# Patient Record
Sex: Male | Born: 1950 | Race: White | Hispanic: No | Marital: Married | Smoking: Never smoker
Health system: Southern US, Community
[De-identification: ages and names within clinical notes are randomized; demographics above are authoritative.]

## PROBLEM LIST (undated history)

## (undated) DIAGNOSIS — N1831 Chronic kidney disease, stage 3a: Secondary | ICD-10-CM

## (undated) DIAGNOSIS — J45909 Unspecified asthma, uncomplicated: Secondary | ICD-10-CM

## (undated) DIAGNOSIS — E119 Type 2 diabetes mellitus without complications: Secondary | ICD-10-CM

## (undated) DIAGNOSIS — N201 Calculus of ureter: Secondary | ICD-10-CM

## (undated) DIAGNOSIS — E785 Hyperlipidemia, unspecified: Secondary | ICD-10-CM

## (undated) DIAGNOSIS — I10 Essential (primary) hypertension: Secondary | ICD-10-CM

## (undated) DIAGNOSIS — Z8709 Personal history of other diseases of the respiratory system: Secondary | ICD-10-CM

## (undated) DIAGNOSIS — Z972 Presence of dental prosthetic device (complete) (partial): Secondary | ICD-10-CM

---

## 2004-08-29 ENCOUNTER — Ambulatory Visit: Payer: Self-pay | Admitting: Gastroenterology

## 2006-10-16 ENCOUNTER — Ambulatory Visit: Payer: Self-pay | Admitting: Internal Medicine

## 2007-02-20 ENCOUNTER — Other Ambulatory Visit: Payer: Self-pay

## 2007-02-20 ENCOUNTER — Emergency Department: Payer: Self-pay | Admitting: Emergency Medicine

## 2007-10-29 ENCOUNTER — Ambulatory Visit: Payer: Self-pay | Admitting: General Practice

## 2013-05-25 ENCOUNTER — Other Ambulatory Visit: Payer: Self-pay | Admitting: Urology

## 2013-05-28 ENCOUNTER — Encounter (HOSPITAL_BASED_OUTPATIENT_CLINIC_OR_DEPARTMENT_OTHER): Payer: Self-pay | Admitting: *Deleted

## 2013-05-28 NOTE — Progress Notes (Signed)
NPO AFTER MN. PT VERBALIZED UNDERSTANDING NO DIPPING TOBACCO AFTER MN. ARRIVES AT 0715. NEEDS ISTAT AND EKG. WILL TAKE METOPROLOL AND LIPITOR AM OF SURG W/ SIP OF WATER.

## 2013-06-04 ENCOUNTER — Encounter (HOSPITAL_BASED_OUTPATIENT_CLINIC_OR_DEPARTMENT_OTHER): Payer: Self-pay | Admitting: Anesthesiology

## 2013-06-04 ENCOUNTER — Encounter (HOSPITAL_BASED_OUTPATIENT_CLINIC_OR_DEPARTMENT_OTHER)
Admission: RE | Disposition: A | Discharge status: Home / Self Care | Payer: Self-pay | Source: Ambulatory Visit | Attending: Urology

## 2013-06-04 ENCOUNTER — Encounter (HOSPITAL_BASED_OUTPATIENT_CLINIC_OR_DEPARTMENT_OTHER): Payer: Self-pay | Admitting: *Deleted

## 2013-06-04 ENCOUNTER — Ambulatory Visit (HOSPITAL_BASED_OUTPATIENT_CLINIC_OR_DEPARTMENT_OTHER): Payer: PRIVATE HEALTH INSURANCE | Admitting: Anesthesiology

## 2013-06-04 ENCOUNTER — Ambulatory Visit (HOSPITAL_COMMUNITY): Payer: PRIVATE HEALTH INSURANCE

## 2013-06-04 ENCOUNTER — Ambulatory Visit (HOSPITAL_BASED_OUTPATIENT_CLINIC_OR_DEPARTMENT_OTHER)
Admission: RE | Admit: 2013-06-04 | Discharge: 2013-06-04 | Disposition: A | Discharge status: Home / Self Care | Payer: PRIVATE HEALTH INSURANCE | Source: Ambulatory Visit | Attending: Urology | Admitting: Urology

## 2013-06-04 DIAGNOSIS — I10 Essential (primary) hypertension: Secondary | ICD-10-CM | POA: Insufficient documentation

## 2013-06-04 DIAGNOSIS — E7800 Pure hypercholesterolemia, unspecified: Secondary | ICD-10-CM | POA: Insufficient documentation

## 2013-06-04 DIAGNOSIS — N201 Calculus of ureter: Secondary | ICD-10-CM | POA: Insufficient documentation

## 2013-06-04 DIAGNOSIS — Z79899 Other long term (current) drug therapy: Secondary | ICD-10-CM | POA: Insufficient documentation

## 2013-06-04 DIAGNOSIS — E119 Type 2 diabetes mellitus without complications: Secondary | ICD-10-CM | POA: Insufficient documentation

## 2013-06-04 DIAGNOSIS — Z7982 Long term (current) use of aspirin: Secondary | ICD-10-CM | POA: Insufficient documentation

## 2013-06-04 HISTORY — DX: Hyperlipidemia, unspecified: E78.5

## 2013-06-04 HISTORY — PX: CYSTOSCOPY WITH RETROGRADE PYELOGRAM, URETEROSCOPY AND STENT PLACEMENT: SHX5789

## 2013-06-04 HISTORY — DX: Type 2 diabetes mellitus without complications: E11.9

## 2013-06-04 HISTORY — DX: Personal history of other diseases of the respiratory system: Z87.09

## 2013-06-04 HISTORY — DX: Calculus of ureter: N20.1

## 2013-06-04 HISTORY — DX: Essential (primary) hypertension: I10

## 2013-06-04 HISTORY — PX: HOLMIUM LASER APPLICATION: SHX5852

## 2013-06-04 LAB — POCT I-STAT 4, (NA,K, GLUC, HGB,HCT)
Glucose, Bld: 170 mg/dL — ABNORMAL HIGH (ref 70–99)
HCT: 40 % (ref 39.0–52.0)
Potassium: 4.2 mEq/L (ref 3.5–5.1)
Sodium: 143 mEq/L (ref 135–145)

## 2013-06-04 SURGERY — CYSTOURETEROSCOPY, WITH RETROGRADE PYELOGRAM AND STENT INSERTION
Anesthesia: General | Site: Ureter | Laterality: Left | Wound class: Clean Contaminated

## 2013-06-04 MED ORDER — DEXAMETHASONE SODIUM PHOSPHATE 4 MG/ML IJ SOLN
INTRAMUSCULAR | Status: DC | PRN
  Administered 2013-06-04: 8 mg via INTRAVENOUS

## 2013-06-04 MED ORDER — CEFAZOLIN SODIUM 1-5 GM-% IV SOLN
1.0000 g | INTRAVENOUS | Status: DC
  Filled 2013-06-04: qty 50

## 2013-06-04 MED ORDER — LACTATED RINGERS IV SOLN
INTRAVENOUS | Status: DC
  Administered 2013-06-04 (×2): via INTRAVENOUS
  Filled 2013-06-04: qty 1000

## 2013-06-04 MED ORDER — PROMETHAZINE HCL 25 MG/ML IJ SOLN
6.2500 mg | INTRAMUSCULAR | Status: DC | PRN
  Filled 2013-06-04: qty 1

## 2013-06-04 MED ORDER — IOHEXOL 350 MG/ML SOLN
INTRAVENOUS | Status: DC | PRN
  Administered 2013-06-04: 17 mL

## 2013-06-04 MED ORDER — HYDROCODONE-ACETAMINOPHEN 5-325 MG PO TABS: 1.0000 | ORAL_TABLET | Freq: Four times a day (QID) | ORAL | Status: DC | PRN

## 2013-06-04 MED ORDER — GLYCOPYRROLATE 0.2 MG/ML IJ SOLN
INTRAMUSCULAR | Status: DC | PRN
  Administered 2013-06-04: 0.2 mg via INTRAVENOUS

## 2013-06-04 MED ORDER — LIDOCAINE HCL (CARDIAC) 20 MG/ML IV SOLN
INTRAVENOUS | Status: DC | PRN
  Administered 2013-06-04: 50 mg via INTRAVENOUS

## 2013-06-04 MED ORDER — CEFAZOLIN SODIUM-DEXTROSE 2-3 GM-% IV SOLR
2.0000 g | INTRAVENOUS | Status: AC
  Administered 2013-06-04: 2 g via INTRAVENOUS
  Filled 2013-06-04: qty 50

## 2013-06-04 MED ORDER — FENTANYL CITRATE 0.05 MG/ML IJ SOLN
INTRAMUSCULAR | Status: DC | PRN
  Administered 2013-06-04: 50 ug via INTRAVENOUS
  Administered 2013-06-04 (×2): 25 ug via INTRAVENOUS

## 2013-06-04 MED ORDER — PROPOFOL 10 MG/ML IV BOLUS
INTRAVENOUS | Status: DC | PRN
  Administered 2013-06-04: 200 mg via INTRAVENOUS

## 2013-06-04 MED ORDER — MIDAZOLAM HCL 5 MG/5ML IJ SOLN
INTRAMUSCULAR | Status: DC | PRN
  Administered 2013-06-04: 2 mg via INTRAVENOUS

## 2013-06-04 MED ORDER — FENTANYL CITRATE 0.05 MG/ML IJ SOLN
25.0000 ug | INTRAMUSCULAR | Status: DC | PRN
  Filled 2013-06-04: qty 1

## 2013-06-04 MED ORDER — KETOROLAC TROMETHAMINE 30 MG/ML IJ SOLN
15.0000 mg | Freq: Once | INTRAMUSCULAR | Status: DC | PRN
  Filled 2013-06-04: qty 1

## 2013-06-04 MED ORDER — ONDANSETRON HCL 4 MG/2ML IJ SOLN
INTRAMUSCULAR | Status: DC | PRN
  Administered 2013-06-04: 4 mg via INTRAVENOUS

## 2013-06-04 MED ORDER — STERILE WATER FOR IRRIGATION IR SOLN
Status: DC | PRN
  Administered 2013-06-04: 6000 mL

## 2013-06-04 SURGICAL SUPPLY — 30 items
ADAPTER CATH URET PLST 4-6FR (CATHETERS) IMPLANT
BAG DRAIN URO-CYSTO SKYTR STRL (DRAIN) ×2 IMPLANT
BASKET LASER NITINOL 1.9FR (BASKET) IMPLANT
BASKET STNLS GEMINI 4WIRE 3FR (BASKET) IMPLANT
BASKET ZERO TIP NITINOL 2.4FR (BASKET) ×2 IMPLANT
BRUSH URET BIOPSY 3F (UROLOGICAL SUPPLIES) IMPLANT
CANISTER SUCT LVC 12 LTR MEDI- (MISCELLANEOUS) ×2 IMPLANT
CATH INTERMIT  6FR 70CM (CATHETERS) IMPLANT
CATH URET 5FR 28IN CONE TIP (BALLOONS)
CATH URET 5FR 28IN OPEN ENDED (CATHETERS) IMPLANT
CATH URET 5FR 70CM CONE TIP (BALLOONS) IMPLANT
CLOTH BEACON ORANGE TIMEOUT ST (SAFETY) ×2 IMPLANT
DRAPE CAMERA CLOSED 9X96 (DRAPES) IMPLANT
GLOVE BIO SURGEON STRL SZ 6 (GLOVE) ×2 IMPLANT
GLOVE BIO SURGEON STRL SZ 6.5 (GLOVE) ×2 IMPLANT
GLOVE BIO SURGEON STRL SZ7 (GLOVE) ×2 IMPLANT
GUIDEWIRE 0.038 PTFE COATED (WIRE) ×2 IMPLANT
GUIDEWIRE ANG ZIPWIRE 038X150 (WIRE) IMPLANT
GUIDEWIRE STR DUAL SENSOR (WIRE) IMPLANT
KIT BALLIN UROMAX 15FX10 (LABEL) IMPLANT
KIT BALLN UROMAX 15FX4 (MISCELLANEOUS) IMPLANT
KIT BALLN UROMAX 26 75X4 (MISCELLANEOUS)
LASER FIBER DISP (UROLOGICAL SUPPLIES) ×2 IMPLANT
LASER FIBER DISP 1000U (UROLOGICAL SUPPLIES) IMPLANT
NS IRRIG 500ML POUR BTL (IV SOLUTION) IMPLANT
PACK CYSTOSCOPY (CUSTOM PROCEDURE TRAY) ×2 IMPLANT
SET HIGH PRES BAL DIL (LABEL)
SHEATH URET ACCESS 12FR/35CM (UROLOGICAL SUPPLIES) IMPLANT
SHEATH URET ACCESS 12FR/55CM (UROLOGICAL SUPPLIES) IMPLANT
STENT URET 6FRX24 CONTOUR (STENTS) ×2 IMPLANT

## 2013-06-04 NOTE — H&P (Signed)
History of Present Illness     Craig Donaldson returns for follow-up management left ureteral calculus.  He had moderate left lower quadrant pain about 2 weeks ago.  He has not had any pain since.  He has not passed a stone either.  He voids well. KUB shows the stone in the same location in the distal left ureter.   Past Medical History Problems  1. History of  Asthma 493.90 2. History of  Diabetes Mellitus 250.00 3. History of  Hepatitis 573.3 4. History of  Hypercholesterolemia 272.0 5. History of  Hypertension 401.9  Current Meds 1. Aspirin 81 MG Oral Tablet; Therapy: (Recorded:23Apr2014) to 2. Atorvastatin Calcium 20 MG Oral Tablet; Therapy: (Recorded:23Apr2014) to 3. Benazepril-Hydrochlorothiazide 20-12.5 MG Oral Tablet; Therapy: (Recorded:23Apr2014) to 4. Byetta 10 MCG Pen 10 MCG/0.04ML SOLN; Therapy: (Recorded:23Apr2014) to 5. Glimepiride 4 MG Oral Tablet; Therapy: (Recorded:23Apr2014) to 6. MetFORMIN HCl 1000 MG Oral Tablet; Therapy: (Recorded:23Apr2014) to 7. Metoprolol Tartrate 50 MG Oral Tablet; Therapy: (Recorded:23Apr2014) to  Allergies Medication  1. No Known Drug Allergies  Family History Problems  1. Family history of  Family Health Status Number Of Children 1 son/ 1 daughter 2. Family history of  Father Deceased At Age 90 lung ca 3. Family history of  Lung Cancer V16.1 4. Paternal history of  Mother Deceased At Age 59 complications with bowel problems 5. Paternal history of  Short Bowel Syndrome  Social History Problems  1. Caffeine Use 3 2. Chewing Nicotine-containing Substances Gum 3. Marital History - Currently Married 4. Never A Smoker Denied  5. History of  Alcohol Use  Review of Systems Genitourinary, constitutional, skin, eye, otolaryngeal, hematologic/lymphatic, cardiovascular, pulmonary, endocrine, musculoskeletal, gastrointestinal, neurological and psychiatric system(s) were reviewed and pertinent findings if present are noted.    Physical  Exam Constitutional: Well nourished and well developed . No acute distress.  ENT:. The ears and nose are normal in appearance.  Neck: The appearance of the neck is normal and no neck mass is present.  Pulmonary: No respiratory distress and normal respiratory rhythm and effort.  Cardiovascular: Heart rate and rhythm are normal . No peripheral edema.  Abdomen: The abdomen is soft and nontender. No masses are palpated. No CVA tenderness. No hernias are palpable. No hepatosplenomegaly noted.  Genitourinary: Examination of the penis demonstrates no discharge, no masses, no lesions and a normal meatus. The scrotum is without lesions. The right epididymis is palpably normal and non-tender. The left epididymis is palpably normal and non-tender. The right testis is non-tender and without masses. The left testis is non-tender and without masses.  Lymphatics: The femoral and inguinal nodes are not enlarged or tender.  Skin: Normal skin turgor, no visible rash and no visible skin lesions.  Neuro/Psych:. Mood and affect are appropriate.    Results/Data Urine [Data Includes: Last 1 Day]   11Aug2014  COLOR YELLOW   APPEARANCE CLEAR   SPECIFIC GRAVITY 1.025   pH 6.0   GLUCOSE NEG mg/dL  BILIRUBIN NEG   KETONE NEG mg/dL  BLOOD TRACE   PROTEIN NEG mg/dL  UROBILINOGEN 0.2 mg/dL  NITRITE NEG   LEUKOCYTE ESTERASE NEG   SQUAMOUS EPITHELIAL/HPF NONE SEEN   WBC NONE SEEN WBC/hpf  RBC 0-2 RBC/hpf  BACTERIA NONE SEEN   CRYSTALS NONE SEEN   CASTS NONE SEEN     KUB INDICATION: Left distal urteral calculus KUB shows normal bowel gas pattern.  Psoas shadows are normal.  There is a 5 mm calcification in the anatomic location of the  left ureter.  It has not significantly changed in position since the last KUB of June 17.   IMPRESSION: Left distal urteral calculus unchanged in position since KUB of June 17.   Assessment Assessed  1. Distal Ureteral Stone On The Left 592.1  Plan Distal Ureteral Stone On  The Left (592.1)  1. KUB  Done: 11Aug2014 12:00AM Health Maintenance (V70.0)  2. UA With REFLEX  Done: 11Aug2014 02:30PM   Since the patient has not passed the stone he needs stone manipulation.  The procedure, risks, benefits were explained to the patient.  The risks include but are not limited to hemorrhage, infection, ureteral injury, inability to find or extract the stone.  He understands and wishes to proceed.

## 2013-06-04 NOTE — Op Note (Signed)
Craig Donaldson is a 62 y.o.   06/04/2013  General  Pre-op diagnosis: Left distal ureteral calculus  Postop diagnosis: Left mid ureteral calculus  Procedure done: Cystoscopy, left retrograde pyelogram, ureteroscopy with holmium laser of left distal ureteral calculus, stone extraction, insertion of double-J stent  Surgeon: Craig Donaldson. Craig Donaldson  Anesthesia: General  Indication: Patient is a 62 years old male who was seen in the office about 4 months ago for gross hematuria. CT scan revealed a 5 mm proximal left ureteral calculus, no renal mass. He has been having pain on and off on the left side. KUB showed that the stone had migrated in the distal ureter. He is now scheduled for cystoscopy and stone manipulation.  Procedure: Patient was identified by his wrist band and proper timeout was taken.  Under general anesthesia he was prepped and draped and placed in the dorsolithotomy position. A panendoscope was inserted in the bladder. The urethra is normal. He has moderate prostatic hypertrophy. The bladder mucosa is normal. There is no stone or tumor in the bladder. The ureteral orifices are in normal position and shape.  Left retrograde pyelogram:  A cone-tip catheter was passed through the cystoscope and the left ureteral orifice. Contrast was then injected through the cone-tip catheter. The distal ureter appears normal. There is a filling defect in the mid ureter consistent with the known stone. The cone-tip catheter was then removed.  A sensor wire was passed through the cystoscope and the left ureter. The cystoscope was removed.  A semirigid ureteroscope was inserted in the bladder but could not be passed through the ureteral orifice. The ureteroscope was removed. The intramural ureter was then dilated with the ureteroscope access sheath. The ureteroscope was then reinserted in the bladder and passed through the ureteral orifice without difficulty. The stone was identified in the mid ureter. With a  365 microfiber holmium laser at a setting of 0.5 J and 20 shocks the stone was fragmented in multiple smaller fragments. The smaller fragments were then removed with the Nitinol basket. There was no evidence of remaining stone fragments in the ureter.  Contrast was then injected through the ureteroscope. There is no evidence of filling defect in the ureter, no extravasation of contrast. The ureteroscope was then removed.  The sensor wire was then backloaded into the cystoscope and a #6 [place] last 24 double-J stent was passed over the sensor wire and when the tip of the double-J stent was noted in the renal pelvis the sensor wire was removed. The proximal curl of the double-J stent is in the collecting system. The distal curl is in the bladder. A string was left attached to the double-J stent. The bladder was then emptied and the cystoscope removed.  The patient tolerated the procedure well and left the OR in satisfactory condition to postanesthesia care unit.

## 2013-06-04 NOTE — Anesthesia Postprocedure Evaluation (Signed)
  Anesthesia Post-op Note  Patient: Craig Donaldson  Procedure(s) Performed: Procedure(s) (LRB): CYSTOSCOPY WITH RETROGRADE PYELOGRAM, URETEROSCOPY AND STENT PLACEMENT (Left) HOLMIUM LASER APPLICATION (Left)  Patient Location: PACU  Anesthesia Type: General  Level of Consciousness: awake and alert   Airway and Oxygen Therapy: Patient Spontanous Breathing  Post-op Pain: mild  Post-op Assessment: Post-op Vital signs reviewed, Patient's Cardiovascular Status Stable, Respiratory Function Stable, Patent Airway and No signs of Nausea or vomiting  Last Vitals:  Filed Vitals:   06/04/13 0945  BP: 128/78  Pulse: 90  Temp:   Resp: 15    Post-op Vital Signs: stable   Complications: No apparent anesthesia complications

## 2013-06-04 NOTE — Anesthesia Procedure Notes (Signed)
Procedure Name: LMA Insertion Date/Time: 06/04/2013 8:45 AM Performed by: Renella Cunas D Pre-anesthesia Checklist: Patient identified, Emergency Drugs available, Suction available and Patient being monitored Patient Re-evaluated:Patient Re-evaluated prior to inductionOxygen Delivery Method: Circle System Utilized Preoxygenation: Pre-oxygenation with 100% oxygen Intubation Type: IV induction Ventilation: Mask ventilation without difficulty LMA: LMA inserted LMA Size: 4.0 Number of attempts: 1 Airway Equipment and Method: bite block Placement Confirmation: positive ETCO2 Tube secured with: Tape Dental Injury: Teeth and Oropharynx as per pre-operative assessment

## 2013-06-04 NOTE — Anesthesia Preprocedure Evaluation (Signed)
Anesthesia Evaluation  Patient identified by MRN, date of birth, ID band Patient awake    Reviewed: Allergy & Precautions, H&P , NPO status , Patient's Chart, lab work & pertinent test results  Airway Mallampati: II TM Distance: <3 FB Neck ROM: Full    Dental no notable dental hx.    Pulmonary neg pulmonary ROS,  breath sounds clear to auscultation  Pulmonary exam normal       Cardiovascular hypertension, Pt. on home beta blockers and Pt. on medications Rhythm:Regular Rate:Normal     Neuro/Psych negative neurological ROS  negative psych ROS   GI/Hepatic negative GI ROS, Neg liver ROS,   Endo/Other  diabetes, Oral Hypoglycemic Agents  Renal/GU negative Renal ROS  negative genitourinary   Musculoskeletal negative musculoskeletal ROS (+)   Abdominal   Peds negative pediatric ROS (+)  Hematology negative hematology ROS (+)   Anesthesia Other Findings   Reproductive/Obstetrics negative OB ROS                           Anesthesia Physical Anesthesia Plan  ASA: II  Anesthesia Plan: General   Post-op Pain Management:    Induction: Intravenous  Airway Management Planned: LMA  Additional Equipment:   Intra-op Plan:   Post-operative Plan:   Informed Consent: I have reviewed the patients History and Physical, chart, labs and discussed the procedure including the risks, benefits and alternatives for the proposed anesthesia with the patient or authorized representative who has indicated his/her understanding and acceptance.   Dental advisory given  Plan Discussed with: CRNA and Surgeon  Anesthesia Plan Comments:         Anesthesia Quick Evaluation

## 2013-06-04 NOTE — Progress Notes (Signed)
Does not need ekg per Dr.Rose

## 2013-06-04 NOTE — Transfer of Care (Signed)
Immediate Anesthesia Transfer of Care Note  Patient: Craig Donaldson  Procedure(s) Performed: Procedure(s) (LRB): CYSTOSCOPY WITH RETROGRADE PYELOGRAM, URETEROSCOPY AND STENT PLACEMENT (Left) HOLMIUM LASER APPLICATION (Left)  Patient Location: PACU  Anesthesia Type: General  Level of Consciousness: awake, oriented, sedated and patient cooperative  Airway & Oxygen Therapy: Patient Spontanous Breathing and Patient connected to face mask oxygen  Post-op Assessment: Report given to PACU RN and Post -op Vital signs reviewed and stable  Post vital signs: Reviewed and stable  Complications: No apparent anesthesia complications

## 2013-06-07 ENCOUNTER — Encounter (HOSPITAL_BASED_OUTPATIENT_CLINIC_OR_DEPARTMENT_OTHER): Payer: Self-pay | Admitting: Urology

## 2016-04-07 ENCOUNTER — Emergency Department
Admission: EM | Admit: 2016-04-07 | Discharge: 2016-04-07 | Disposition: A | Discharge status: Home / Self Care | Payer: Medicare Other | Attending: Emergency Medicine | Admitting: Emergency Medicine

## 2016-04-07 ENCOUNTER — Other Ambulatory Visit: Payer: Self-pay

## 2016-04-07 ENCOUNTER — Encounter: Payer: Self-pay | Admitting: Emergency Medicine

## 2016-04-07 DIAGNOSIS — T7840XA Allergy, unspecified, initial encounter: Secondary | ICD-10-CM | POA: Diagnosis not present

## 2016-04-07 DIAGNOSIS — Z79899 Other long term (current) drug therapy: Secondary | ICD-10-CM | POA: Diagnosis not present

## 2016-04-07 DIAGNOSIS — I10 Essential (primary) hypertension: Secondary | ICD-10-CM | POA: Insufficient documentation

## 2016-04-07 DIAGNOSIS — Z960 Presence of urogenital implants: Secondary | ICD-10-CM | POA: Insufficient documentation

## 2016-04-07 DIAGNOSIS — E119 Type 2 diabetes mellitus without complications: Secondary | ICD-10-CM | POA: Diagnosis not present

## 2016-04-07 DIAGNOSIS — J45909 Unspecified asthma, uncomplicated: Secondary | ICD-10-CM | POA: Diagnosis not present

## 2016-04-07 DIAGNOSIS — Z7984 Long term (current) use of oral hypoglycemic drugs: Secondary | ICD-10-CM | POA: Insufficient documentation

## 2016-04-07 DIAGNOSIS — F17290 Nicotine dependence, other tobacco product, uncomplicated: Secondary | ICD-10-CM | POA: Insufficient documentation

## 2016-04-07 DIAGNOSIS — Z7982 Long term (current) use of aspirin: Secondary | ICD-10-CM | POA: Insufficient documentation

## 2016-04-07 DIAGNOSIS — E785 Hyperlipidemia, unspecified: Secondary | ICD-10-CM | POA: Diagnosis not present

## 2016-04-07 DIAGNOSIS — R21 Rash and other nonspecific skin eruption: Secondary | ICD-10-CM | POA: Diagnosis present

## 2016-04-07 MED ORDER — RANITIDINE HCL 150 MG PO TABS: 150.0000 mg | ORAL_TABLET | Freq: Two times a day (BID) | ORAL | Status: DC

## 2016-04-07 MED ORDER — HYDROXYZINE PAMOATE 25 MG PO CAPS: 25.0000 mg | ORAL_CAPSULE | Freq: Three times a day (TID) | ORAL | Status: DC | PRN

## 2016-04-07 MED ORDER — FAMOTIDINE 20 MG PO TABS
20.0000 mg | ORAL_TABLET | Freq: Once | ORAL | Status: AC
  Administered 2016-04-07: 20 mg via ORAL
  Filled 2016-04-07: qty 1

## 2016-04-07 MED ORDER — DEXAMETHASONE SODIUM PHOSPHATE 4 MG/ML IJ SOLN
4.0000 mg | Freq: Once | INTRAMUSCULAR | Status: AC
  Administered 2016-04-07: 4 mg via INTRAMUSCULAR
  Filled 2016-04-07: qty 1

## 2016-04-07 MED ORDER — PREDNISONE 10 MG PO TABS: ORAL_TABLET | ORAL | Status: DC

## 2016-04-07 NOTE — ED Notes (Signed)
States he had some dental work done last week. was placed on amoxil then developed a fine rash .rash noted to both arms legs and abd .  No resp distress noted  Positive itching

## 2016-04-07 NOTE — ED Provider Notes (Signed)
[facility] Emergency Department Provider Note  ____________________________________________  Time seen: Approximately 10:28 AM  I have reviewed the triage vital signs and the nursing notes.   HISTORY  Chief Complaint Rash and Allergic Reaction    HPI Craig Donaldson is a 65 y.o. male who presents for evaluation to the emergency room with a fine red raised rash to his arms abdomen and back some aches. Patient reports a recent (for amoxicillin after some dental work. Reports similar history of same to amoxicillin. Denies any difficulty breathing. Denies any chest pains tightness or shortness of breath.   Past Medical History  Diagnosis Date  . Hypertension   . Type 2 diabetes mellitus (HCC)   . Left ureteral calculus   . History of asthma     NO ISSUES SINCE THE 1990'S  . Hyperlipidemia     There are no active problems to display for this patient.   Past Surgical History  Procedure Laterality Date  . Cystoscopy with retrograde pyelogram, ureteroscopy and stent placement Left 06/04/2013    Procedure: CYSTOSCOPY WITH RETROGRADE PYELOGRAM, URETEROSCOPY AND STENT PLACEMENT;  Surgeon: Lindaann Slough, MD;  Location: [facility] [place];  Service: Urology;  Laterality: Left;  1 HR   . Holmium laser application Left 06/04/2013    Procedure: HOLMIUM LASER APPLICATION;  Surgeon: Lindaann Slough, MD;  Location: [facility] [place];  Service: Urology;  Laterality: Left;    Current Outpatient Rx  Name  Route  Sig  Dispense  Refill  . linagliptin (TRADJENTA) 5 MG TABS tablet   Oral   Take 5 mg by mouth daily.         Marland Kitchen aspirin 81 MG tablet   Oral   Take 81 mg by mouth daily.         Marland Kitchen atorvastatin (LIPITOR) 20 MG tablet   Oral   Take 20 mg by mouth every morning.         . benazepril-hydrochlorthiazide (LOTENSIN HCT) 10-12.5 MG per tablet   Oral   Take 1 tablet by mouth every morning.         Marland Kitchen exenatide (BYETTA 10 MCG PEN) 10  MCG/0.04ML SOPN injection   Subcutaneous   Inject into the skin 2 (two) times daily with a meal.         . glimepiride (AMARYL) 4 MG tablet   Oral   Take 4 mg by mouth 2 (two) times daily.         Marland Kitchen HYDROcodone-acetaminophen (NORCO) 5-325 MG per tablet   Oral   Take 1 tablet by mouth every 6 (six) hours as needed for pain.   30 tablet   0   . hydrOXYzine (VISTARIL) 25 MG capsule   Oral   Take 1 capsule (25 mg total) by mouth 3 (three) times daily as needed.   30 capsule   0   . metFORMIN (GLUCOPHAGE) 1000 MG tablet   Oral   Take 1,000 mg by mouth 2 (two) times daily with a meal.         . metoprolol succinate (TOPROL-XL) 50 MG 24 hr tablet   Oral   Take 50 mg by mouth every morning. Take with or immediately following a meal.         . predniSONE (DELTASONE) 10 MG tablet      Take 6 tablets on day 1 and then decrease by 1 tablet daily.   21 tablet   0   . ranitidine (ZANTAC) 150  MG tablet   Oral   Take 1 tablet (150 mg total) by mouth 2 (two) times daily.   14 tablet   1     Allergies Review of patient's allergies indicates no known allergies.  No family history on file.  Social History Social History  Substance Use Topics  . Smoking status: Never Smoker   . Smokeless tobacco: Current User    Types: Snuff, Chew     Comment: 34 YR TOBACCO USE  . Alcohol Use: No    Review of Systems Constitutional: No fever/chills ENT: No sore throat. Cardiovascular: Denies chest pain. Respiratory: Denies shortness of breath. Gastrointestinal: No abdominal pain.  No nausea, no vomiting.  No diarrhea.  No constipation. Musculoskeletal: Negative for back pain. Skin: Positive for rash. Positive for itching Neurological: Negative for headaches, focal weakness or numbness.  10-point ROS otherwise negative.  ____________________________________________   PHYSICAL EXAM:  VITAL SIGNS: ED Triage Vitals  Enc Vitals Group     BP 04/07/16 1014 119/85 mmHg      Pulse Rate 04/07/16 1014 83     Resp 04/07/16 1014 18     Temp 04/07/16 1014 97.7 F (36.5 C)     Temp Source 04/07/16 1014 Oral     SpO2 04/07/16 1014 96 %     Weight 04/07/16 1014 199 lb (90.266 kg)     Height 04/07/16 1014 5' 6.5" (1.689 m)     Head Cir --      Peak Flow --      Pain Score --      Pain Loc --      Pain Edu? --      Excl. in GC? --     Constitutional: Alert and oriented. Well appearing and in no acute distress. Head: Atraumatic. Nose: No congestion/rhinnorhea. Mouth/Throat: Mucous membranes are moist.  Oropharynx non-erythematous. Neck: No stridor.Supple full range of motion   Cardiovascular: Regular rate and rhythm with occasional sinus positive. Grossly normal heart sounds.  Good peripheral circulation. Respiratory: Normal respiratory effort.  No retractions. Lungs CTAB. No wheezing noted. Gastrointestinal: Soft and nontender. No distention. No abdominal bruits. No CVA tenderness. Musculoskeletal: No lower extremity tenderness nor edema.  No joint effusions. Neurologic:  Normal speech and language. No gross focal neurologic deficits are appreciated. No gait instability. Skin:  Skin is warm, dry and intact. Positive rash Psychiatric: Mood and affect are normal. Speech and behavior are normal.  ____________________________________________   LABS (all labs ordered are listed, but only abnormal results are displayed)  Labs Reviewed - No data to display ____________________________________________  EKG  PVCs noted. No acute STEMI. ____________________________________________  RADIOLOGY   ____________________________________________   PROCEDURES  Procedure(s) performed: None  Critical Care performed: No  ____________________________________________   INITIAL IMPRESSION / ASSESSMENT AND PLAN / ED COURSE  Pertinent labs & imaging results that were available during my care of the patient were reviewed by me and considered in my medical decision  making (see chart for details).  Acute allergic reaction to amoxicillin. New PVCs noted. Rx given for Vistaril 25 mg, Zantac 150 mg. Patient was placed on a 5 day tapering dose of prednisone. He is to follow up with his PCP or return to ER with any worsening symptomology. ____________________________________________   FINAL CLINICAL IMPRESSION(S) / ED DIAGNOSES  Final diagnoses:  Allergic reaction caused by a drug     This chart was dictated using voice recognition software/Dragon. Despite best efforts to proofread, errors can occur which can change the  meaning. Any change was purely unintentional.   Evangeline Dakinharles M Beers, PA-C 04/07/16 1113  Jeanmarie PlantJames A McShane, MD 04/07/16 1355

## 2016-04-07 NOTE — ED Notes (Signed)
Pt presents to ED with fine, red, raised rash to arms, abdomen, back, and legs. Pt states recently began treatment with amoxicillin after dental work. Pt states had a similar reaction to amoxicillin in the past. Pt speaking in complete sentences. Pt in no apparent distress in triage. Pt reports rash itches.

## 2018-01-07 DIAGNOSIS — Z862 Personal history of diseases of the blood and blood-forming organs and certain disorders involving the immune mechanism: Secondary | ICD-10-CM | POA: Insufficient documentation

## 2019-06-08 ENCOUNTER — Emergency Department
Admission: EM | Admit: 2019-06-08 | Discharge: 2019-06-08 | Disposition: A | Discharge status: Home / Self Care | Payer: Medicare Other | Attending: Student | Admitting: Student

## 2019-06-08 ENCOUNTER — Other Ambulatory Visit: Payer: Self-pay

## 2019-06-08 ENCOUNTER — Encounter: Payer: Self-pay | Admitting: Emergency Medicine

## 2019-06-08 ENCOUNTER — Emergency Department: Payer: Medicare Other

## 2019-06-08 DIAGNOSIS — F17220 Nicotine dependence, chewing tobacco, uncomplicated: Secondary | ICD-10-CM | POA: Insufficient documentation

## 2019-06-08 DIAGNOSIS — I10 Essential (primary) hypertension: Secondary | ICD-10-CM | POA: Diagnosis not present

## 2019-06-08 DIAGNOSIS — Z7982 Long term (current) use of aspirin: Secondary | ICD-10-CM | POA: Diagnosis not present

## 2019-06-08 DIAGNOSIS — Z79899 Other long term (current) drug therapy: Secondary | ICD-10-CM | POA: Diagnosis not present

## 2019-06-08 DIAGNOSIS — R42 Dizziness and giddiness: Secondary | ICD-10-CM | POA: Insufficient documentation

## 2019-06-08 DIAGNOSIS — Z7984 Long term (current) use of oral hypoglycemic drugs: Secondary | ICD-10-CM | POA: Insufficient documentation

## 2019-06-08 DIAGNOSIS — E119 Type 2 diabetes mellitus without complications: Secondary | ICD-10-CM | POA: Insufficient documentation

## 2019-06-08 DIAGNOSIS — J45909 Unspecified asthma, uncomplicated: Secondary | ICD-10-CM | POA: Insufficient documentation

## 2019-06-08 LAB — BASIC METABOLIC PANEL
Anion gap: 12 (ref 5–15)
BUN: 23 mg/dL (ref 8–23)
CO2: 25 mmol/L (ref 22–32)
Calcium: 9.6 mg/dL (ref 8.9–10.3)
Chloride: 102 mmol/L (ref 98–111)
Creatinine, Ser: 1.2 mg/dL (ref 0.61–1.24)
GFR calc Af Amer: >60 mL/min (ref >60)
GFR calc non Af Amer: >60 mL/min (ref >60)
Glucose, Bld: 150 mg/dL — ABNORMAL HIGH (ref 70–99)
Potassium: 4 mmol/L (ref 3.5–5.1)
Sodium: 139 mmol/L (ref 135–145)

## 2019-06-08 LAB — CBC
HCT: 39.8 % (ref 39.0–52.0)
Hemoglobin: 13.1 g/dL (ref 13.0–17.0)
MCH: 29.8 pg (ref 26.0–34.0)
MCHC: 32.9 g/dL (ref 30.0–36.0)
MCV: 90.7 fL (ref 80.0–100.0)
Platelets: 314 10*3/uL (ref 150–400)
RBC: 4.39 MIL/uL (ref 4.22–5.81)
RDW: 14.1 % (ref 11.5–15.5)
WBC: 12.2 10*3/uL — ABNORMAL HIGH (ref 4.0–10.5)
nRBC: 0 % (ref 0.0–0.2)

## 2019-06-08 LAB — URINALYSIS, COMPLETE (UACMP) WITH MICROSCOPIC
Bacteria, UA: NONE SEEN
Bilirubin Urine: NEGATIVE
Glucose, UA: NEGATIVE mg/dL
Hgb urine dipstick: NEGATIVE
Ketones, ur: NEGATIVE mg/dL
Leukocytes,Ua: NEGATIVE
Nitrite: NEGATIVE
Protein, ur: NEGATIVE mg/dL
Specific Gravity, Urine: 1.017 (ref 1.005–1.030)
pH: 5 (ref 5.0–8.0)

## 2019-06-08 MED ORDER — MECLIZINE HCL 12.5 MG PO TABS: 12.5000 mg | ORAL_TABLET | Freq: Three times a day (TID) | ORAL | 0 refills | Status: DC | PRN

## 2019-06-08 MED ORDER — FLUTICASONE PROPIONATE 50 MCG/ACT NA SUSP: 1.0000 | Freq: Every day | NASAL | 0 refills | Status: DC

## 2019-06-08 MED ORDER — MECLIZINE HCL 25 MG PO TABS
12.5000 mg | ORAL_TABLET | Freq: Once | ORAL | Status: AC
  Administered 2019-06-08: 21:00:00 12.5 mg via ORAL
  Filled 2019-06-08: qty 1

## 2019-06-08 NOTE — ED Provider Notes (Signed)
[facility] Emergency Department Provider Note  ____________________________________________   First MD Initiated Contact with Patient 06/08/19 1955     (approximate)  I have reviewed the triage vital signs and the nursing notes.  History  Chief Complaint Dizziness    HPI Craig Donaldson is a 68 y.o. male with hx of HTN, HLD who presents to the ED for an episode of dizziness. Episode lasted less than 2 hours in total. He states he was at work when he developed sudden onset of spinning sensation. Particularly worse when he moves his head quickly or changes positions. On arrival to the ED his symptoms have nearly resolved.  History of "inner ear problems" with similar dizziness. No syncope, lightheadedness, chest pain, shortness of breath, weakness, numbness, or tingling. He reports some mild sinus congestion for ~1 week, otherwise no recent illnesses.          Past Medical Hx Past Medical History:  Diagnosis Date  . History of asthma    NO ISSUES SINCE THE 1990'S  . Hyperlipidemia   . Hypertension   . Left ureteral calculus   . Type 2 diabetes mellitus ([place])     Problem List There are no active problems to display for this patient.   Past Surgical Hx Past Surgical History:  Procedure Laterality Date  . CYSTOSCOPY WITH RETROGRADE PYELOGRAM, URETEROSCOPY AND STENT PLACEMENT Left 06/04/2013   Procedure: CYSTOSCOPY WITH RETROGRADE PYELOGRAM, URETEROSCOPY AND STENT PLACEMENT;  Surgeon: Hanley Ben, MD;  Location: [place];  Service: Urology;  Laterality: Left;  1 HR   . HOLMIUM LASER APPLICATION Left 0/53/9767   Procedure: HOLMIUM LASER APPLICATION;  Surgeon: Hanley Ben, MD;  Location: [place];  Service: Urology;  Laterality: Left;    Medications Prior to Admission medications   Medication Sig Start Date End Date Taking? Authorizing Provider  aspirin 81 MG tablet Take 81 mg by mouth daily.    [provider]  atorvastatin (LIPITOR) 20 MG tablet Take 20 mg by mouth every morning.    [provider]  benazepril-hydrochlorthiazide (LOTENSIN HCT) 10-12.5 MG per tablet Take 1 tablet by mouth every morning.    [provider]  exenatide (BYETTA 10 MCG PEN) 10 MCG/0.04ML SOPN injection Inject into the skin 2 (two) times daily with a meal.    [provider]  glimepiride (AMARYL) 4 MG tablet Take 4 mg by mouth 2 (two) times daily.    [provider]  HYDROcodone-acetaminophen (NORCO) 5-325 MG per tablet Take 1 tablet by mouth every 6 (six) hours as needed for pain. 06/04/13   [name], MD  hydrOXYzine (VISTARIL) 25 MG capsule Take 1 capsule (25 mg total) by mouth 3 (three) times daily as needed. 04/07/16   Beers, Pierce Crane, PA-C  linagliptin (TRADJENTA) 5 MG TABS tablet Take 5 mg by mouth daily.    [provider]  metFORMIN (GLUCOPHAGE) 1000 MG tablet Take 1,000 mg by mouth 2 (two) times daily with a meal.    [provider]  metoprolol succinate (TOPROL-XL) 50 MG 24 hr tablet Take 50 mg by mouth every morning. Take with or immediately following a meal.    [provider]  predniSONE (DELTASONE) 10 MG tablet Take 6 tablets on day 1 and then decrease by 1 tablet daily. 04/07/16   Beers, Pierce Crane, PA-C  ranitidine (ZANTAC) 150 MG tablet Take 1 tablet (150 mg total) by mouth 2 (two) times daily. 04/07/16   Beers,  Charmayne Sheerharles M, PA-C    Allergies Amoxicillin  Family Hx History reviewed. No pertinent family history.  Social Hx Social History   Tobacco Use  . Smoking status: Never Smoker  . Smokeless tobacco: Current User    Types: Snuff, Chew  . Tobacco comment: 35 YR TOBACCO USE  Substance Use Topics  . Alcohol use: No  . Drug use: No     Review of Systems  Constitutional: Negative for fever. Negative for chills. Eyes: Negative for visual changes. ENT: Negative for sore throat. Cardiovascular: Negative for chest pain.  Respiratory: Negative for shortness of breath. Gastrointestinal: Negative for abdominal pain. Negative for nausea. Negative for vomiting. Genitourinary: Negative for dysuria. Musculoskeletal: Negative for leg swelling. Skin: Negative for rash. Neurological: + dizziness   Physical Exam  Vital Signs: ED Triage Vitals  Enc Vitals Group     BP 06/08/19 1807 131/79     Pulse Rate 06/08/19 1807 68     Resp 06/08/19 1817 18     Temp 06/08/19 1807 98 F (36.7 C)     Temp Source 06/08/19 1807 Oral     SpO2 06/08/19 1807 95 %     Weight 06/08/19 1807 200 lb (90.7 kg)     Height 06/08/19 1807 5\' 5"  (1.651 m)     Head Circumference --      Peak Flow --      Pain Score 06/08/19 1817 0     Pain Loc --      Pain Edu? --      Excl. in GC? --     Constitutional: Alert and oriented.  Eyes: Conjunctivae clear. Sclera anicteric. No nystagmus. Head: Normocephalic. Atraumatic. Ear: Cerumen in bilateral canal. EACs otherwise unremarkable, no vesicles or lesions. Visible TM grey without erythema or effusion. Nose: No rhinorrhea. Mouth/Throat: Mucous membranes are moist.  Neck: No stridor.   Cardiovascular: Normal rate, regular rhythm. No murmurs. Extremities well perfused. Respiratory: Normal respiratory effort.  Lungs CTAB. Gastrointestinal: Soft and non-tender. No distention.  Musculoskeletal: No lower extremity edema. Neurologic:  Normal speech and language. No gross focal neurologic deficits are appreciated. Alert and oriented.  Face symmetric.  Tongue midline.  Cranial nerves II through XII intact. UE and LE strength 5/5 and symmetric. UE and LE SILT. NIHSS 0. Skin: Skin is warm, dry and intact. No rash noted. Psychiatric: Mood and affect are appropriate for situation.  EKG  Personally reviewed.   Rate: 67 Rhythm: sinus Axis: leftward Intervals: within normal limits Non-specific T wave inversion in aVL, V2 No acute ischemic changes   Radiology  CT: IMPRESSION: 1. No acute  intracranial findings. 2. Right maxillary sinusitis.   Procedures  Procedure(s) performed (including critical care):  Procedures   Initial Impression / Assessment and Plan / ED Course  68 y.o. male who presents to the ED for an episode of dizziness, lasting 1-2 hours, now essentially resolved. As above.   Presentation seems most consistent with peripheral vertigo, perhaps BPPV vs labyrinthitis (w/ history of sinus congestion). Less likely central, but will obtain CT head to r/o acute CVA or other intracranial pathology.    Labs w/o actionable derangements. EKG w/o acute ischemic changes. CT head negative aside from right sided sinusitis. Feels improved after meclizine, asymptomatic, has ambulated without issues and without symptoms. Will plan for DC with Flonase/antihistamine for sinuses, and a few tablets of meclizine if needed for symptoms. Given no systemic symptoms, and sinus congestion < 1 week do not feel antibiotics indicated at this time.  Advised PCP follow up, given return precautions. Patient agreeable with plan.    Final Clinical Impression(s) / ED Diagnosis  Final diagnoses:  Dizziness       Note:  This document was prepared using Dragon voice recognition software and may include unintentional dictation errors.   Miguel Aschoff., MD 06/08/19 2249

## 2019-06-08 NOTE — ED Triage Notes (Signed)
Pt here for dizziness that started this AM. Feels a little lightheaded but describes room spinning as well.  Felt same when had inner ear problem. Dizziness worse when moves head fast or changes position. No pain. VSS. NAD.

## 2019-06-08 NOTE — ED Notes (Signed)

## 2019-06-08 NOTE — Discharge Instructions (Addendum)
Thank you for letting us take care of you in the emergency department today.   Please continue to take your regular, prescribed medications.   New medications we have prescribed:  - Flonase - Meclizine (use only as needed for dizziness episodes)  Please pick up Zyrtec or Claritin over the counter and take as directed for the next week.  Please follow up with: - Your primary care doctor to review your ER visit and follow up on your symptoms.   Please return to the ER for any new or worsening symptoms.

## 2020-01-20 ENCOUNTER — Emergency Department: Payer: Worker's Compensation

## 2020-01-20 ENCOUNTER — Encounter: Payer: Self-pay | Admitting: Emergency Medicine

## 2020-01-20 ENCOUNTER — Other Ambulatory Visit: Payer: Self-pay

## 2020-01-20 ENCOUNTER — Emergency Department
Admission: EM | Admit: 2020-01-20 | Discharge: 2020-01-20 | Disposition: A | Discharge status: Home / Self Care | Payer: Worker's Compensation | Attending: Emergency Medicine | Admitting: Emergency Medicine

## 2020-01-20 DIAGNOSIS — Z7984 Long term (current) use of oral hypoglycemic drugs: Secondary | ICD-10-CM | POA: Insufficient documentation

## 2020-01-20 DIAGNOSIS — Z7982 Long term (current) use of aspirin: Secondary | ICD-10-CM | POA: Diagnosis not present

## 2020-01-20 DIAGNOSIS — F17220 Nicotine dependence, chewing tobacco, uncomplicated: Secondary | ICD-10-CM | POA: Insufficient documentation

## 2020-01-20 DIAGNOSIS — S92505A Nondisplaced unspecified fracture of left lesser toe(s), initial encounter for closed fracture: Secondary | ICD-10-CM | POA: Diagnosis not present

## 2020-01-20 DIAGNOSIS — I10 Essential (primary) hypertension: Secondary | ICD-10-CM | POA: Diagnosis not present

## 2020-01-20 DIAGNOSIS — Y999 Unspecified external cause status: Secondary | ICD-10-CM | POA: Diagnosis not present

## 2020-01-20 DIAGNOSIS — Y9389 Activity, other specified: Secondary | ICD-10-CM | POA: Diagnosis not present

## 2020-01-20 DIAGNOSIS — W208XXA Other cause of strike by thrown, projected or falling object, initial encounter: Secondary | ICD-10-CM | POA: Diagnosis not present

## 2020-01-20 DIAGNOSIS — E119 Type 2 diabetes mellitus without complications: Secondary | ICD-10-CM | POA: Insufficient documentation

## 2020-01-20 DIAGNOSIS — S99922A Unspecified injury of left foot, initial encounter: Secondary | ICD-10-CM | POA: Diagnosis present

## 2020-01-20 DIAGNOSIS — Y929 Unspecified place or not applicable: Secondary | ICD-10-CM | POA: Insufficient documentation

## 2020-01-20 MED ORDER — SILVER SULFADIAZINE 1 % EX CREA: TOPICAL_CREAM | CUTANEOUS | 0 refills | Status: DC

## 2020-01-20 NOTE — Discharge Instructions (Addendum)
You are being treated for a toe fracture. You have a blister over the toe, which indicates the underlying broken toe. Keep the blister clean, dry, and intact. Use the post-op shoe to walk around. Take OTC Tylenol as needed for pain. You may follow-up with Dr. Alberteen Spindle next week as discussed.

## 2020-01-20 NOTE — ED Notes (Signed)
See triage note  Presents with injury to left foot  States he dropped a 5-6 pd part onto left foot  Bruising and swelling noted

## 2020-01-20 NOTE — ED Triage Notes (Signed)
Pt from work with left foot injury. States he was working and dropped a 4-5 pound part on his left foot. Pt has type 2 DM and neuropathy. This is a workers comp injury. Pt alert & oriented, nad noted.

## 2020-01-21 NOTE — ED Provider Notes (Signed)
[facility] Emergency Department Provider Note ____________________________________________  Time seen: 34  I have reviewed the triage vital signs and the nursing notes.  HISTORY  Chief Complaint  Foot Injury  HPI Craig Donaldson is a 69 y.o. male presents himself to the ED for evaluation of a foot injury from a day prior. He admits to dropping a 5-lb valve handle onto his foot at work. He was wearing work boots (not steel-toe) at the time. He reprots minimal pain, reportedly due to his diabetic neuropathy. He was concerned when he developed blisters and bruising over the 3rd toe. He denies any other injury at this time.   Past Medical History:  Diagnosis Date  . History of asthma    NO ISSUES SINCE THE 1990'S  . Hyperlipidemia   . Hypertension   . Left ureteral calculus   . Type 2 diabetes mellitus ([facility])     There are no problems to display for this patient.   Past Surgical History:  Procedure Laterality Date  . CYSTOSCOPY WITH RETROGRADE PYELOGRAM, URETEROSCOPY AND STENT PLACEMENT Left 06/04/2013   Procedure: CYSTOSCOPY WITH RETROGRADE PYELOGRAM, URETEROSCOPY AND STENT PLACEMENT;  Surgeon: Hanley Ben, MD;  Location: [place];  Service: Urology;  Laterality: Left;  1 HR   . HOLMIUM LASER APPLICATION Left 05/27/4817   Procedure: HOLMIUM LASER APPLICATION;  Surgeon: Hanley Ben, MD;  Location: [place];  Service: Urology;  Laterality: Left;    Prior to Admission medications   Medication Sig Start Date End Date Taking? Authorizing Provider  aspirin 81 MG tablet Take 81 mg by mouth daily.    [provider]  atorvastatin (LIPITOR) 20 MG tablet Take 20 mg by mouth every morning.    [provider]  benazepril-hydrochlorthiazide (LOTENSIN HCT) 10-12.5 MG per tablet Take 1 tablet by mouth every morning.    [provider]  exenatide (BYETTA 10 MCG PEN) 10 MCG/0.04ML SOPN injection Inject  into the skin 2 (two) times daily with a meal.    [provider]  fluticasone (FLONASE) 50 MCG/ACT nasal spray Place 1 spray into both nostrils daily for 14 days. 06/08/19 06/22/19  [name], MD  glimepiride (AMARYL) 4 MG tablet Take 4 mg by mouth 2 (two) times daily.    [provider]  HYDROcodone-acetaminophen (NORCO) 5-325 MG per tablet Take 1 tablet by mouth every 6 (six) hours as needed for pain. 06/04/13   [name], MD  hydrOXYzine (VISTARIL) 25 MG capsule Take 1 capsule (25 mg total) by mouth 3 (three) times daily as needed. 04/07/16   Beers, Pierce Crane, PA-C  linagliptin (TRADJENTA) 5 MG TABS tablet Take 5 mg by mouth daily.    [provider]  meclizine (ANTIVERT) 12.5 MG tablet Take 1 tablet (12.5 mg total) by mouth 3 (three) times daily as needed for dizziness. 06/08/19   [name], MD  metFORMIN (GLUCOPHAGE) 1000 MG tablet Take 1,000 mg by mouth 2 (two) times daily with a meal.    [provider]  metoprolol succinate (TOPROL-XL) 50 MG 24 hr tablet Take 50 mg by mouth every morning. Take with or immediately following a meal.    [provider]  silver sulfADIAZINE (SILVADENE) 1 % cream Apply to affected area daily 01/20/20   [name], [name], PA-C    Allergies Amoxicillin  History reviewed. No pertinent family history.  Social History Social History   Tobacco Use  . Smoking status: Never Smoker  .  Smokeless tobacco: Current User    Types: Snuff  . Tobacco comment: 35 YR TOBACCO USE  Substance Use Topics  . Alcohol use: No  . Drug use: No    Review of Systems  Constitutional: Negative for fever. Cardiovascular: Negative for chest pain. Respiratory: Negative for shortness of breath. Musculoskeletal: Negative for back pain. Left toe pain as above Skin: Negative for rash. Neurological: Negative for headaches, focal weakness or numbness. ____________________________________________  PHYSICAL EXAM:  VITAL  SIGNS: ED Triage Vitals [01/20/20 1720]  Enc Vitals Group     BP (!) 160/86     Pulse Rate 77     Resp 18     Temp 98 F (36.7 C)     Temp Source Oral     SpO2 96 %     Weight 205 lb (93 kg)     Height 5\' 6"  (1.676 m)     Head Circumference      Peak Flow      Pain Score 5     Pain Loc      Pain Edu?      Excl. in GC?     Constitutional: Alert and oriented. Well appearing and in no distress. Head: Normocephalic and atraumatic. Eyes: Conjunctivae are normal. Normal extraocular movements Neck: Supple. No thyromegaly. Hematological/Lymphatic/Immunological: No cervical lymphadenopathy. Cardiovascular: Normal rate, regular rhythm. Normal distal pulses. Respiratory: Normal respiratory effort.  Musculoskeletal: Left foot with ecchymosis and intact fracture blisters over the 3rd digit dorsally. No SUH or nail avulsion. Nontender with normal range of motion in all extremities.  Neurologic:  Normal gross sensation. Normal speech and language. No gross focal neurologic deficits are appreciated. Skin:  Skin is warm, dry and intact. No rash noted. ____________________________________________   RADIOLOGY  DG Left Foot  Patient with non-displaced, oblique fracture to the distal phalanx of the 3rd toe.  I, , personally viewed and evaluated these images (plain radiographs) as part of my medical decision making, as well as reviewing the written report by the radiologist. ____________________________________________  PROCEDURES  Dry dressing Post-op shoe   Procedures ____________________________________________  INITIAL IMPRESSION / ASSESSMENT AND PLAN / ED COURSE  Patient with ED evaluation of a left foot contusion and toe blisters. He is found to have fracture blisters over the left 3rd toe consistent with a suspected oblique fracture of the same. He is placed in a post-op shoe, and discharged with fracture care instructions. He is advised to leave the blisters  intact. He is referred to podiatry for further management. Work activities as tolerated.   [name] was evaluated in Emergency Department on 01/21/2020 for the symptoms described in the history of present illness. He was evaluated in the context of the global COVID-19 pandemic, which necessitated consideration that the patient might be at risk for infection with the SARS-CoV-2 virus that causes COVID-19. Institutional protocols and algorithms that pertain to the evaluation of patients at risk for COVID-19 are in a state of rapid change based on information released by regulatory bodies including the CDC and federal and state organizations. These policies and algorithms were followed during the patient's care in the ED. ____________________________________________  FINAL CLINICAL IMPRESSION(S) / ED DIAGNOSES  Final diagnoses:  Closed nondisplaced fracture of phalanx of lesser toe of left foot, unspecified phalanx, initial encounter      03/22/2020, PA-C 01/21/20 1356    03/22/20, MD 01/26/20 1529

## 2020-01-31 DIAGNOSIS — N183 Chronic kidney disease, stage 3 unspecified: Secondary | ICD-10-CM | POA: Diagnosis present

## 2021-04-30 DIAGNOSIS — Z85828 Personal history of other malignant neoplasm of skin: Secondary | ICD-10-CM | POA: Insufficient documentation

## 2022-01-19 ENCOUNTER — Observation Stay
Admission: EM | Admit: 2022-01-19 | Discharge: 2022-01-22 | Disposition: A | Discharge status: Home Health | Payer: Medicare PPO | Attending: Internal Medicine | Admitting: Internal Medicine

## 2022-01-19 ENCOUNTER — Emergency Department: Payer: Medicare PPO

## 2022-01-19 ENCOUNTER — Encounter: Payer: Self-pay | Admitting: Internal Medicine

## 2022-01-19 ENCOUNTER — Other Ambulatory Visit: Payer: Self-pay

## 2022-01-19 DIAGNOSIS — E1122 Type 2 diabetes mellitus with diabetic chronic kidney disease: Secondary | ICD-10-CM | POA: Insufficient documentation

## 2022-01-19 DIAGNOSIS — S99912A Unspecified injury of left ankle, initial encounter: Secondary | ICD-10-CM | POA: Diagnosis present

## 2022-01-19 DIAGNOSIS — S82852A Displaced trimalleolar fracture of left lower leg, initial encounter for closed fracture: Principal | ICD-10-CM | POA: Insufficient documentation

## 2022-01-19 DIAGNOSIS — E785 Hyperlipidemia, unspecified: Secondary | ICD-10-CM | POA: Diagnosis not present

## 2022-01-19 DIAGNOSIS — Z79899 Other long term (current) drug therapy: Secondary | ICD-10-CM | POA: Insufficient documentation

## 2022-01-19 DIAGNOSIS — Z7982 Long term (current) use of aspirin: Secondary | ICD-10-CM | POA: Diagnosis not present

## 2022-01-19 DIAGNOSIS — E782 Mixed hyperlipidemia: Secondary | ICD-10-CM | POA: Diagnosis present

## 2022-01-19 DIAGNOSIS — N1831 Chronic kidney disease, stage 3a: Secondary | ICD-10-CM | POA: Diagnosis not present

## 2022-01-19 DIAGNOSIS — E1169 Type 2 diabetes mellitus with other specified complication: Secondary | ICD-10-CM

## 2022-01-19 DIAGNOSIS — E119 Type 2 diabetes mellitus without complications: Secondary | ICD-10-CM

## 2022-01-19 DIAGNOSIS — Z7984 Long term (current) use of oral hypoglycemic drugs: Secondary | ICD-10-CM | POA: Insufficient documentation

## 2022-01-19 DIAGNOSIS — D72829 Elevated white blood cell count, unspecified: Secondary | ICD-10-CM | POA: Diagnosis present

## 2022-01-19 DIAGNOSIS — W010XXA Fall on same level from slipping, tripping and stumbling without subsequent striking against object, initial encounter: Secondary | ICD-10-CM | POA: Diagnosis not present

## 2022-01-19 DIAGNOSIS — J45909 Unspecified asthma, uncomplicated: Secondary | ICD-10-CM | POA: Insufficient documentation

## 2022-01-19 DIAGNOSIS — N183 Chronic kidney disease, stage 3 unspecified: Secondary | ICD-10-CM | POA: Diagnosis present

## 2022-01-19 DIAGNOSIS — S8252XA Displaced fracture of medial malleolus of left tibia, initial encounter for closed fracture: Principal | ICD-10-CM

## 2022-01-19 DIAGNOSIS — I129 Hypertensive chronic kidney disease with stage 1 through stage 4 chronic kidney disease, or unspecified chronic kidney disease: Secondary | ICD-10-CM | POA: Diagnosis not present

## 2022-01-19 DIAGNOSIS — Z8709 Personal history of other diseases of the respiratory system: Secondary | ICD-10-CM

## 2022-01-19 DIAGNOSIS — S82892A Other fracture of left lower leg, initial encounter for closed fracture: Secondary | ICD-10-CM | POA: Diagnosis present

## 2022-01-19 DIAGNOSIS — I10 Essential (primary) hypertension: Secondary | ICD-10-CM | POA: Diagnosis present

## 2022-01-19 DIAGNOSIS — S82832A Other fracture of upper and lower end of left fibula, initial encounter for closed fracture: Secondary | ICD-10-CM

## 2022-01-19 DIAGNOSIS — S82899A Other fracture of unspecified lower leg, initial encounter for closed fracture: Secondary | ICD-10-CM | POA: Diagnosis present

## 2022-01-19 DIAGNOSIS — W19XXXA Unspecified fall, initial encounter: Secondary | ICD-10-CM | POA: Diagnosis present

## 2022-01-19 HISTORY — DX: Chronic kidney disease, stage 3a: N18.31

## 2022-01-19 LAB — CBC WITH DIFFERENTIAL/PLATELET
Abs Immature Granulocytes: 0.11 10*3/uL — ABNORMAL HIGH (ref 0.00–0.07)
Basophils Absolute: 0.1 10*3/uL (ref 0.0–0.1)
Basophils Relative: 1 %
Eosinophils Absolute: 0.4 10*3/uL (ref 0.0–0.5)
Eosinophils Relative: 3 %
HCT: 40.4 % (ref 39.0–52.0)
Hemoglobin: 12.7 g/dL — ABNORMAL LOW (ref 13.0–17.0)
Immature Granulocytes: 1 %
Lymphocytes Relative: 16 %
Lymphs Abs: 2.2 10*3/uL (ref 0.7–4.0)
MCH: 29.8 pg (ref 26.0–34.0)
MCHC: 31.4 g/dL (ref 30.0–36.0)
MCV: 94.8 fL (ref 80.0–100.0)
Monocytes Absolute: 1 10*3/uL (ref 0.1–1.0)
Monocytes Relative: 7 %
Neutro Abs: 10.1 10*3/uL — ABNORMAL HIGH (ref 1.7–7.7)
Neutrophils Relative %: 72 %
Platelets: 406 10*3/uL — ABNORMAL HIGH (ref 150–400)
RBC: 4.26 MIL/uL (ref 4.22–5.81)
RDW: 14.8 % (ref 11.5–15.5)
WBC: 13.8 10*3/uL — ABNORMAL HIGH (ref 4.0–10.5)
nRBC: 0 % (ref 0.0–0.2)

## 2022-01-19 LAB — PROTIME-INR
INR: 1 (ref 0.8–1.2)
Prothrombin Time: 13.3 seconds (ref 11.4–15.2)

## 2022-01-19 LAB — CBG MONITORING, ED: Glucose-Capillary: 232 mg/dL — ABNORMAL HIGH (ref 70–99)

## 2022-01-19 LAB — TYPE AND SCREEN
ABO/RH(D): A POS
Antibody Screen: NEGATIVE

## 2022-01-19 LAB — APTT: aPTT: 26 seconds (ref 24–36)

## 2022-01-19 LAB — COMPREHENSIVE METABOLIC PANEL
ALT: 19 U/L (ref 0–44)
AST: 22 U/L (ref 15–41)
Albumin: 3.6 g/dL (ref 3.5–5.0)
Alkaline Phosphatase: 29 U/L — ABNORMAL LOW (ref 38–126)
Anion gap: 8 (ref 5–15)
BUN: 34 mg/dL — ABNORMAL HIGH (ref 8–23)
CO2: 25 mmol/L (ref 22–32)
Calcium: 9.5 mg/dL (ref 8.9–10.3)
Chloride: 105 mmol/L (ref 98–111)
Creatinine, Ser: 1.59 mg/dL — ABNORMAL HIGH (ref 0.61–1.24)
GFR, Estimated: 46 mL/min — ABNORMAL LOW (ref >60)
Glucose, Bld: 137 mg/dL — ABNORMAL HIGH (ref 70–99)
Potassium: 4 mmol/L (ref 3.5–5.1)
Sodium: 138 mmol/L (ref 135–145)
Total Bilirubin: 0.6 mg/dL (ref 0.3–1.2)
Total Protein: 7.9 g/dL (ref 6.5–8.1)

## 2022-01-19 LAB — GLUCOSE, CAPILLARY: Glucose-Capillary: 236 mg/dL — ABNORMAL HIGH (ref 70–99)

## 2022-01-19 MED ORDER — OMEGA-3-ACID ETHYL ESTERS 1 G PO CAPS
1.0000 g | ORAL_CAPSULE | Freq: Every day | ORAL | Status: DC
  Administered 2022-01-21 – 2022-01-22 (×2): 1 g via ORAL
  Filled 2022-01-19 (×2): qty 1

## 2022-01-19 MED ORDER — MORPHINE SULFATE (PF) 4 MG/ML IV SOLN
4.0000 mg | Freq: Once | INTRAVENOUS | Status: AC
  Administered 2022-01-19: 4 mg via INTRAVENOUS

## 2022-01-19 MED ORDER — INSULIN ASPART 100 UNIT/ML IJ SOLN
0.0000 [IU] | Freq: Three times a day (TID) | INTRAMUSCULAR | Status: DC
  Administered 2022-01-19: 3 [IU] via SUBCUTANEOUS
  Administered 2022-01-20: 7 [IU] via SUBCUTANEOUS
  Administered 2022-01-20 – 2022-01-21 (×4): 5 [IU] via SUBCUTANEOUS
  Administered 2022-01-22 (×2): 3 [IU] via SUBCUTANEOUS
  Filled 2022-01-19 (×8): qty 1

## 2022-01-19 MED ORDER — ACETAMINOPHEN 325 MG PO TABS: 650.0000 mg | ORAL_TABLET | Freq: Four times a day (QID) | ORAL | Status: DC | PRN

## 2022-01-19 MED ORDER — SENNOSIDES-DOCUSATE SODIUM 8.6-50 MG PO TABS: 1.0000 | ORAL_TABLET | Freq: Every evening | ORAL | Status: DC | PRN

## 2022-01-19 MED ORDER — ONDANSETRON HCL 4 MG/2ML IJ SOLN
4.0000 mg | Freq: Once | INTRAMUSCULAR | Status: AC
  Administered 2022-01-19: 4 mg via INTRAVENOUS
  Filled 2022-01-19: qty 2

## 2022-01-19 MED ORDER — ONDANSETRON HCL 4 MG/2ML IJ SOLN
4.0000 mg | Freq: Three times a day (TID) | INTRAMUSCULAR | Status: DC | PRN
Start: 2022-01-19 — End: 2022-01-20

## 2022-01-19 MED ORDER — METOPROLOL SUCCINATE ER 50 MG PO TB24
50.0000 mg | ORAL_TABLET | Freq: Every morning | ORAL | Status: DC
  Administered 2022-01-20 – 2022-01-22 (×3): 50 mg via ORAL
  Filled 2022-01-19 (×2): qty 1

## 2022-01-19 MED ORDER — MORPHINE SULFATE (PF) 4 MG/ML IV SOLN
4.0000 mg | Freq: Once | INTRAVENOUS | Status: DC
  Filled 2022-01-19: qty 1

## 2022-01-19 MED ORDER — ADULT MULTIVITAMIN W/MINERALS CH
1.0000 | ORAL_TABLET | Freq: Every day | ORAL | Status: DC
  Administered 2022-01-21 – 2022-01-22 (×2): 1 via ORAL
  Filled 2022-01-19 (×2): qty 1

## 2022-01-19 MED ORDER — MORPHINE SULFATE (PF) 2 MG/ML IV SOLN
2.0000 mg | INTRAVENOUS | Status: DC | PRN
  Administered 2022-01-20: 2 mg via INTRAVENOUS
  Filled 2022-01-19: qty 1

## 2022-01-19 MED ORDER — INSULIN ASPART 100 UNIT/ML IJ SOLN
0.0000 [IU] | Freq: Every day | INTRAMUSCULAR | Status: DC
  Administered 2022-01-19: 2 [IU] via SUBCUTANEOUS
  Administered 2022-01-20 – 2022-01-21 (×2): 4 [IU] via SUBCUTANEOUS
  Filled 2022-01-19 (×3): qty 1

## 2022-01-19 MED ORDER — FENOFIBRATE 160 MG PO TABS
160.0000 mg | ORAL_TABLET | Freq: Every day | ORAL | Status: DC
  Administered 2022-01-21 – 2022-01-22 (×2): 160 mg via ORAL
  Filled 2022-01-19 (×3): qty 1

## 2022-01-19 MED ORDER — BENAZEPRIL HCL 10 MG PO TABS
10.0000 mg | ORAL_TABLET | Freq: Every day | ORAL | Status: DC
  Administered 2022-01-21 – 2022-01-22 (×2): 10 mg via ORAL
  Filled 2022-01-19 (×3): qty 1

## 2022-01-19 MED ORDER — ATORVASTATIN CALCIUM 20 MG PO TABS
20.0000 mg | ORAL_TABLET | Freq: Every morning | ORAL | Status: DC
  Administered 2022-01-21 – 2022-01-22 (×2): 20 mg via ORAL
  Filled 2022-01-19: qty 1

## 2022-01-19 MED ORDER — HYDROCHLOROTHIAZIDE 12.5 MG PO TABS
12.5000 mg | ORAL_TABLET | Freq: Every day | ORAL | Status: DC
  Administered 2022-01-21 – 2022-01-22 (×2): 12.5 mg via ORAL
  Filled 2022-01-19 (×2): qty 1

## 2022-01-19 MED ORDER — METHOCARBAMOL 500 MG PO TABS
500.0000 mg | ORAL_TABLET | Freq: Three times a day (TID) | ORAL | Status: DC | PRN
  Administered 2022-01-19 – 2022-01-22 (×4): 500 mg via ORAL
  Filled 2022-01-19 (×5): qty 1

## 2022-01-19 MED ORDER — DM-GUAIFENESIN ER 30-600 MG PO TB12
1.0000 | ORAL_TABLET | Freq: Two times a day (BID) | ORAL | Status: DC | PRN
  Filled 2022-01-19: qty 1

## 2022-01-19 MED ORDER — HYDRALAZINE HCL 20 MG/ML IJ SOLN: 5.0000 mg | INTRAMUSCULAR | Status: DC | PRN

## 2022-01-19 MED ORDER — CEFAZOLIN SODIUM-DEXTROSE 2-4 GM/100ML-% IV SOLN
2.0000 g | INTRAVENOUS | Status: AC
  Administered 2022-01-20: 2 g via INTRAVENOUS
  Filled 2022-01-19: qty 100

## 2022-01-19 MED ORDER — HYDROCODONE-ACETAMINOPHEN 5-325 MG PO TABS
1.0000 | ORAL_TABLET | ORAL | Status: DC | PRN
  Administered 2022-01-19: 1 via ORAL
  Filled 2022-01-19: qty 1

## 2022-01-19 MED ORDER — VITAMIN D 25 MCG (1000 UNIT) PO TABS
1000.0000 [IU] | ORAL_TABLET | Freq: Every day | ORAL | Status: DC
  Administered 2022-01-21 – 2022-01-22 (×2): 1000 [IU] via ORAL
  Filled 2022-01-19 (×2): qty 1

## 2022-01-19 MED ORDER — VITAMIN B-12 1000 MCG PO TABS
1000.0000 ug | ORAL_TABLET | Freq: Every day | ORAL | Status: DC
  Administered 2022-01-21 – 2022-01-22 (×2): 1000 ug via ORAL
  Filled 2022-01-19 (×2): qty 1

## 2022-01-19 MED ORDER — ALBUTEROL SULFATE (2.5 MG/3ML) 0.083% IN NEBU: 3.0000 mL | INHALATION_SOLUTION | RESPIRATORY_TRACT | Status: DC | PRN

## 2022-01-19 MED ORDER — GLUCERNA SHAKE PO LIQD
237.0000 mL | Freq: Three times a day (TID) | ORAL | Status: DC
Start: 2022-01-19 — End: 2022-01-22
  Administered 2022-01-19 – 2022-01-22 (×6): 237 mL via ORAL

## 2022-01-19 MED ORDER — INSULIN GLARGINE-YFGN 100 UNIT/ML ~~LOC~~ SOLN
33.0000 [IU] | Freq: Every day | SUBCUTANEOUS | Status: DC
  Administered 2022-01-21: 33 [IU] via SUBCUTANEOUS
  Filled 2022-01-19 (×3): qty 0.33

## 2022-01-19 MED ORDER — BENAZEPRIL-HYDROCHLOROTHIAZIDE 10-12.5 MG PO TABS: 1.0000 | ORAL_TABLET | Freq: Every morning | ORAL | Status: DC
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
## 2022-01-19 NOTE — H&P (Signed)
?History and Physical  ? ? ?Craig Donaldson [id number] DOB: [birth year] DOA: 01/19/2022 ? ?Referring MD/NP/PA:  ? ?PCP: Dion Body, MD  ? ?Patient coming from:  The patient is coming from home.  At baseline, pt is independent for most of ADL.       ? ?Chief Complaint: fall and left ankle pain ? ?HPI: Craig Donaldson is a 71 y.o. male with medical history significant of hypertension, hyperlipidemia, diabetes mellitus, asthma, left ureteral calculus, CKD-IIIa, who presents with fall and left ankle pain. ? ?Pt states that he fell when he was cutting down some trees this AM, but slipped in some wet grass and mud.  He injured his left ankle, developed left ankle pain, which is constant, sharp, moderate to severe, nonradiating.  He strongly denies any head or neck injury.  No any neck or head pain.  No loss of consciousness. Patient does not have chest pain, cough, shortness of breath.  No nausea, vomiting, diarrhea or abdominal pain.  No symptoms of UTI. ? ?Data Reviewed and ED Course: pt was found to have WBC 13.8, stable renal function. Temperature normal, blood pressure 131/72, heart rate 77, RR 18, oxygen saturation 96% on room air.  X-ray of left foot is negative.  X-ray of left ankle showed left medial malleolus fracture and left distal fibula fracture.  Patient is admitted to [facility] bed as inpatient.  Dr. Mack Guise of Ortho is consulted. ? ?EKG:  Not done in ED, will get one.   ? ?Review of Systems:  ? ?General: no fevers, chills, no body weight gain, fatigue ?HEENT: no blurry vision, hearing changes or sore throat ?Respiratory: no dyspnea, coughing, wheezing ?CV: no chest pain, no palpitations ?GI: no nausea, vomiting, abdominal pain, diarrhea, constipation ?GU: no dysuria, burning on urination, increased urinary frequency, hematuria  ?Ext: no leg edema ?Neuro: no unilateral weakness, numbness, or tingling, no vision change or hearing loss. Has fall ?Skin: no rash, no skin tear. ?MSK: has pain in left  ankle ?Heme: No easy bruising.  ?Travel history: No recent long distant travel. ? ? ?Allergy:  ?Allergies  ?Allergen Reactions  ? Amoxicillin Rash  ? Other Rash  ? ? ?Past Medical History:  ?Diagnosis Date  ? Chronic kidney disease, stage 3a ([place])   ? History of asthma   ? NO ISSUES SINCE THE 1990'S  ? Hyperlipidemia   ? Hypertension   ? Left ureteral calculus   ? Type 2 diabetes mellitus ([place])   ? ? ?Past Surgical History:  ?Procedure Laterality Date  ? CYSTOSCOPY WITH RETROGRADE PYELOGRAM, URETEROSCOPY AND STENT PLACEMENT Left 06/04/2013  ? Procedure: CYSTOSCOPY WITH RETROGRADE PYELOGRAM, URETEROSCOPY AND STENT PLACEMENT;  Surgeon: Hanley Ben, MD;  Location: [facility];  Service: Urology;  Laterality: Left;  1 HR ?  ? HOLMIUM LASER APPLICATION Left 0000000  ? Procedure: HOLMIUM LASER APPLICATION;  Surgeon: Hanley Ben, MD;  Location: [facility];  Service: Urology;  Laterality: Left;  ? ? ?Social History:  reports that he has never smoked. His smokeless tobacco use includes snuff. He reports that he does not drink alcohol and does not use drugs. ? ?Family History:  ?Family History  ?Problem Relation Age of Onset  ? Lung cancer Father   ? Diabetes Brother   ?  ? ?Prior to Admission medications   ?Medication Sig Start Date End Date Taking? Authorizing Provider  ?aspirin 81 MG tablet Take 81 mg by mouth daily.    [provider]  ?  atorvastatin (LIPITOR) 20 MG tablet Take 20 mg by mouth every morning.    [provider]  ?benazepril-hydrochlorthiazide (LOTENSIN HCT) 10-12.5 MG per tablet Take 1 tablet by mouth every morning.    [provider]  ?exenatide (BYETTA 10 MCG PEN) 10 MCG/0.04ML SOPN injection Inject into the skin 2 (two) times daily with a meal.    [provider]  ?fluticasone (FLONASE) 50 MCG/ACT nasal spray Place 1 spray into both nostrils daily for 14 days. 06/08/19 06/22/19  [name], MD  ?glimepiride (AMARYL) 4 MG tablet  Take 4 mg by mouth 2 (two) times daily.    [provider]  ?HYDROcodone-acetaminophen (NORCO) 5-325 MG per tablet Take 1 tablet by mouth every 6 (six) hours as needed for pain. 06/04/13   [name], MD  ?hydrOXYzine (VISTARIL) 25 MG capsule Take 1 capsule (25 mg total) by mouth 3 (three) times daily as needed. 04/07/16   Beers, Pierce Crane, PA-C  ?linagliptin (TRADJENTA) 5 MG TABS tablet Take 5 mg by mouth daily.    [provider]  ?meclizine (ANTIVERT) 12.5 MG tablet Take 1 tablet (12.5 mg total) by mouth 3 (three) times daily as needed for dizziness. 06/08/19   [name], MD  ?metFORMIN (GLUCOPHAGE) 1000 MG tablet Take 1,000 mg by mouth 2 (two) times daily with a meal.    [provider]  ?metoprolol succinate (TOPROL-XL) 50 MG 24 hr tablet Take 50 mg by mouth every morning. Take with or immediately following a meal.    [provider]  ?silver sulfADIAZINE (SILVADENE) 1 % cream Apply to affected area daily 01/20/20   Menshew, [name], PA-C  ? ? ?Physical Exam: ?Vitals:  ? 01/19/22 1232 01/19/22 1236  ?BP: 131/72   ?Pulse: 77   ?Resp: 18   ?Temp: 99 ?F (37.2 ?C)   ?TempSrc: Oral   ?SpO2: 96%   ?Weight:  86.2 kg  ? ?General: Not in acute distress ?HEENT: ?      Eyes: PERRL, EOMI, no scleral icterus. ?      ENT: No discharge from the ears and nose, no pharynx injection, no tonsillar enlargement.  ?      Neck: No JVD, no bruit, no mass felt. ?Heme: No neck lymph node enlargement. ?Cardiac: S1/S2, RRR, No murmurs, No gallops or rubs. ?Respiratory: No rales, wheezing, rhonchi or rubs. ?GI: Soft, nondistended, nontender, no rebound pain, no organomegaly, BS present. ?GU: No hematuria ?Ext: No pitting leg edema bilaterally. 1+DP/PT pulse bilaterally. ?Musculoskeletal: has tenderness in left ankle ?Skin: No rashes.  ?Neuro: Alert, oriented X3, cranial nerves II-XII grossly intact, moves all extremities normally.  ?Psych: Patient is not psychotic, no suicidal or hemocidal  ideation. ? ?Labs on Admission: I have personally reviewed following labs and imaging studies ? ?CBC: ?Recent Labs  ?Lab 01/19/22 ?1359  ?WBC 13.8*  ?NEUTROABS 10.1*  ?HGB 12.7*  ?HCT 40.4  ?MCV 94.8  ?PLT 406*  ? ?Basic Metabolic Panel: ?Recent Labs  ?Lab 01/19/22 ?1420  ?NA 138  ?K 4.0  ?CL 105  ?CO2 25  ?GLUCOSE 137*  ?BUN 34*  ?CREATININE 1.59*  ?CALCIUM 9.5  ? ?GFR: ?CrCl cannot be calculated (Unknown ideal weight.). ?Liver Function Tests: ?Recent Labs  ?Lab 01/19/22 ?1420  ?AST 22  ?ALT 19  ?ALKPHOS 29*  ?BILITOT 0.6  ?PROT 7.9  ?ALBUMIN 3.6  ? ?No results for input(s): LIPASE, AMYLASE in the last 168 hours. ?No results for input(s): AMMONIA in the last 168 hours. ?Coagulation  Profile: ?Recent Labs  ?Lab 01/19/22 ?1446  ?INR 1.0  ? ?Cardiac Enzymes: ?No results for input(s): CKTOTAL, CKMB, CKMBINDEX, TROPONINI in the last 168 hours. ?BNP (last 3 results) ?No results for input(s): PROBNP in the last 8760 hours. ?HbA1C: ?No results for input(s): HGBA1C in the last 72 hours. ?CBG: ?No results for input(s): GLUCAP in the last 168 hours. ?Lipid Profile: ?No results for input(s): CHOL, HDL, LDLCALC, TRIG, CHOLHDL, LDLDIRECT in the last 72 hours. ?Thyroid Function Tests: ?No results for input(s): TSH, T4TOTAL, FREET4, T3FREE, THYROIDAB in the last 72 hours. ?Anemia Panel: ?No results for input(s): VITAMINB12, FOLATE, FERRITIN, TIBC, IRON, RETICCTPCT in the last 72 hours. ?Urine analysis: ?   ?Component Value Date/Time  ? COLORURINE YELLOW (A) 06/08/2019 1819  ? APPEARANCEUR CLEAR (A) 06/08/2019 1819  ? LABSPEC 1.017 06/08/2019 1819  ? PHURINE 5.0 06/08/2019 1819  ? [place] NEGATIVE 06/08/2019 1819  ? [place] NEGATIVE 06/08/2019 1819  ? [place] NEGATIVE 06/08/2019 1819  ? [place] NEGATIVE 06/08/2019 1819  ? [place] NEGATIVE 06/08/2019 1819  ? NITRITE NEGATIVE 06/08/2019 1819  ? LEUKOCYTESUR NEGATIVE 06/08/2019 1819  ? ?Sepsis Labs: ?@LABRCNTIP (procalcitonin:4,lacticidven:4) ?)No results found for this or any  previous visit (from the past 240 hour(s)).  ? ?Radiological Exams on Admission: ?DG Ankle Complete Left ? ?Result Date: 01/19/2022 ?CLINICAL DATA:  Fall, injury to ankle. EXAM: LEFT ANKLE COMPLETE - 3+ VIEW COM

## 2022-01-19 NOTE — ED Triage Notes (Signed)
Pt was in the woods cutting trees down with his wife when he lost his footing and twisted his ankle and felt a pop. Pt is currently resting on the stretcher at this time with pt left ankle and foot splinted by EMS. A/Ox4, NAD, VS WNL ?

## 2022-01-19 NOTE — Progress Notes (Signed)
Initial Nutrition Assessment ? ?DOCUMENTATION CODES:  ? ?Obesity unspecified ? ?INTERVENTION:  ? ?-Glucerna Shake po TID, each supplement provides 220 kcal and 10 grams of protein  ?-MVI with minerals daily ?-Liberalize diet to carb modified for wider variety of meal selections ? ?NUTRITION DIAGNOSIS:  ? ?Increased nutrient needs related to post-op healing as evidenced by estimated needs. ? ?GOAL:  ? ?Patient will meet greater than or equal to 90% of their needs ? ?MONITOR:  ? ?PO intake, Supplement acceptance, Labs, Weight trends, Skin, I & O's ? ?REASON FOR ASSESSMENT:  ? ?Consult ?Assessment of nutrition requirement/status, Hip fracture protocol ? ?ASSESSMENT:  ? ?71 year old male with history of HTN, DM, and hyprlipidemia has been admitted with lt ankle fracture s/p fall. ? ?Pt admitted with lt ankle fracture s/p fall.  ?  ?Pt unavailable at time of visit. Attempted to speak with pt via call to hospital room phone, however, unable to reach. RD unable to obtain further nutrition-related history or complete nutrition-focused physical exam at this time.   ? ?Pt awaiting orthopedics consult. ? ?Pt currently on a heart healthy/ carb modified diet. No meal completion data available at this time.  ? ?Reviewed wt hx; pt with history of mild wt loss.  ? ?Pt with increased nutritional needs and would benefit from addition of oral nutrition supplements.  ? ?Medications reviewed. ? ?No results found for: HGBA1C PTA DM medications are none.  ? ?Labs reviewed: CBGS: 150 (inpatient orders for glycemic control are none).   ? ?Diet Order:   ?Diet Order   ? ?       ?  Diet heart healthy/carb modified Room service appropriate? Yes; Fluid consistency: Thin  Diet effective now       ?  ? ?  ?  ? ?  ? ? ?EDUCATION NEEDS:  ? ?No education needs have been identified at this time ? ?Skin:  Skin Assessment: Reviewed RN Assessment ? ?Last BM:  Unknown ? ?Height:  ? ?Ht Readings from Last 1 Encounters:  ?01/20/20 5\' 6"  (1.676 m)   ? ? ?Weight:  ? ?Wt Readings from Last 1 Encounters:  ?01/19/22 86.2 kg  ? ? ?Ideal Body Weight:  64.5 kg ? ?BMI:  Body mass index is 30.67 kg/m?. ? ?Estimated Nutritional Needs:  ? ?Kcal:  1750-1950 ? ?Protein:  85-100 grams ? ?Fluid:  > 1.7 L ? ? ? ?03/21/22, RD, LDN, CDCES ?Registered Dietitian II ?Certified Diabetes Care and Education Specialist ?Please refer to [facility] for RD and/or RD on-call/weekend/after hours pager  ?

## 2022-01-19 NOTE — Consult Note (Signed)
?ORTHOPAEDIC CONSULTATION ? ?REQUESTING PHYSICIAN: Ivor Costa, MD ? ?Chief Complaint: Left ankle pain status post fall ? ?HPI: ?Craig Donaldson is a 71 y.o. male who was seen in the ED with his wife at the bedside.  Patient explains that he and his wife are cutting down small trees today.  They were pulling them into the woods down a slope when his left ankle rolled.  He felt a pop.  He was brought to the [facility] ER where x-rays revealed a displaced bimalleolar ankle fracture.  Patient is being admitted to the hospital service for preop evaluation.  Orthopedics is consulted for management of the patient's fracture. ? ?Past Medical History:  ?Diagnosis Date  ? Chronic kidney disease, stage 3a ([place])   ? History of asthma   ? NO ISSUES SINCE THE 1990'S  ? Hyperlipidemia   ? Hypertension   ? Left ureteral calculus   ? Type 2 diabetes mellitus ([place])   ? ?Past Surgical History:  ?Procedure Laterality Date  ? CYSTOSCOPY WITH RETROGRADE PYELOGRAM, URETEROSCOPY AND STENT PLACEMENT Left 06/04/2013  ? Procedure: CYSTOSCOPY WITH RETROGRADE PYELOGRAM, URETEROSCOPY AND STENT PLACEMENT;  Surgeon: Hanley Ben, MD;  Location: [facility];  Service: Urology;  Laterality: Left;  1 HR ?  ? HOLMIUM LASER APPLICATION Left 0000000  ? Procedure: HOLMIUM LASER APPLICATION;  Surgeon: Hanley Ben, MD;  Location: [facility];  Service: Urology;  Laterality: Left;  ? ?Social History  ? ?Socioeconomic History  ? Marital status: Married  ?  Spouse name: Not on file  ? Number of children: Not on file  ? Years of education: Not on file  ? Highest education level: Not on file  ?Occupational History  ? Not on file  ?Tobacco Use  ? Smoking status: Never  ? Smokeless tobacco: Current  ?  Types: Snuff  ? Tobacco comments:  ?  35 YR TOBACCO USE  ?Substance and Sexual Activity  ? Alcohol use: No  ? Drug use: No  ? Sexual activity: Not on file  ?Other Topics Concern  ? Not on file  ?Social History Narrative  ? Not on  file  ? ?Social Determinants of Health  ? ?Financial Resource Strain: Not on file  ?Food Insecurity: Not on file  ?Transportation Needs: Not on file  ?Physical Activity: Not on file  ?Stress: Not on file  ?Social Connections: Not on file  ? ?Family History  ?Problem Relation Age of Onset  ? Lung cancer Father   ? Diabetes Brother   ? ?Allergies  ?Allergen Reactions  ? Amoxicillin Rash  ? Other Rash  ? ?Prior to Admission medications   ?Medication Sig Start Date End Date Taking? Authorizing Provider  ?aspirin 81 MG tablet Take 81 mg by mouth daily.   Yes [provider]  ?atorvastatin (LIPITOR) 20 MG tablet Take 20 mg by mouth every morning.   Yes [provider]  ?benazepril-hydrochlorthiazide (LOTENSIN HCT) 10-12.5 MG per tablet Take 1 tablet by mouth every morning.   Yes [provider]  ?Cholecalciferol 25 MCG (1000 UT) capsule Take 1,000 Units by mouth daily.   Yes [provider]  ?cyanocobalamin 1000 MCG tablet Take 1,000 mcg by mouth daily.   Yes [provider]  ?fenofibrate 160 MG tablet Take 160 mg by mouth daily. 12/09/21  Yes [provider]  ?glimepiride (AMARYL) 4 MG tablet Take 4 mg by mouth 2 (two) times daily.   Yes [provider]  ?linagliptin (TRADJENTA) 5 MG  TABS tablet Take 5 mg by mouth daily.   Yes [provider]  ?metFORMIN (GLUCOPHAGE) 1000 MG tablet Take 1,000 mg by mouth 2 (two) times daily with a meal.   Yes [provider]  ?metoprolol succinate (TOPROL-XL) 50 MG 24 hr tablet Take 50 mg by mouth every morning. Take with or immediately following a meal.   Yes [provider]  ?Omega-3 Fatty Acids (FISH OIL) 1000 MG CAPS Take 1 capsule by mouth daily.   Yes [provider]  ?Lenon Curt SOLOSTAR 300 UNIT/ML Solostar Pen Inject 66 Units into the skin daily. 12/13/21  Yes [provider]  ?albuterol (VENTOLIN HFA) 108 (90 Base) MCG/ACT inhaler Inhale 2 puffs into the lungs in the  morning, at noon, in the evening, and at bedtime. 10/29/21   [provider]  ? ?DG Ankle Complete Left ? ?Result Date: 01/19/2022 ?CLINICAL DATA:  Fall, injury to ankle. EXAM: LEFT ANKLE COMPLETE - 3+ VIEW COMPARISON:  None. FINDINGS: Displaced fractures of the medial malleolus and distal fibula. Ankle mortise remains grossly symmetric. No fracture is seen within the underlying talus or hindfoot. Soft tissue swelling overlying the medial malleolus. IMPRESSION: Displaced fractures of the medial malleolus and distal fibula. Associated soft tissue swelling. Electronically Signed   By: Franki Cabot M.D.   On: 01/19/2022 13:04  ? ?DG Chest Port 1 View ? ?Result Date: 01/19/2022 ?CLINICAL DATA:  Preoperative respiratory evaluation for left ankle surgery. EXAM: PORTABLE CHEST 1 VIEW COMPARISON:  10/29/2007 FINDINGS: 1425 hours. The lungs are clear without focal pneumonia, edema, pneumothorax or pleural effusion. The cardiopericardial silhouette is within normal limits for size. The visualized bony structures of the thorax are unremarkable. IMPRESSION: No active disease. Electronically Signed   By: Misty Stanley M.D.   On: 01/19/2022 14:41   ? ?Positive ROS: All other systems have been reviewed and were otherwise negative with the exception of those mentioned in the HPI and as above. ? ?Physical Exam: ?General: Alert, no acute distress ? ?MUSCULOSKELETAL: Left lower extremity: Patient has an Ortho-Glass U-splint around the left ankle placed by the ED.  His left foot is well-perfused.  He has palpable pedal pulses, intact sensation light touch and can flex and extend his toes and gently dorsiflex and plantarflex his ankle his compartments are soft compressible.   ? ?Assessment: ?Left closed displaced bimalleolar ankle fracture ? ?Plan: ?The patient and his wife have seen his x-rays.  I explained to them that given that he has displaced fractures of both the medial malleolus and distal fibula, I would recommend open  reduction internal fixation of his fracture.  He has mild subluxation of the talus.  I described the operation and postoperative course in detail with the patient and his wife.  I answered all her questions. ?I discussed the risks and benefits of surgery. The risks include but are not limited to infection, bleeding  nerve or blood vessel injury, joint stiffness or loss of motion, persistent pain, weakness or instability, malunion, nonunion, hardware failure and the need for further surgery.  Patient understood these risks and wished to proceed.  ? ? ? ?[place], MD ? ? ? ?01/19/2022 ?6:26 PM ? ?  ?

## 2022-01-20 ENCOUNTER — Inpatient Hospital Stay: Payer: Medicare PPO

## 2022-01-20 ENCOUNTER — Other Ambulatory Visit: Payer: Self-pay

## 2022-01-20 ENCOUNTER — Inpatient Hospital Stay: Payer: Medicare PPO | Admitting: Anesthesiology

## 2022-01-20 ENCOUNTER — Encounter
Admission: EM | Disposition: A | Discharge status: Home Health | Payer: Self-pay | Source: Home / Self Care | Attending: Emergency Medicine

## 2022-01-20 DIAGNOSIS — S82892A Other fracture of left lower leg, initial encounter for closed fracture: Secondary | ICD-10-CM | POA: Diagnosis not present

## 2022-01-20 HISTORY — PX: ORIF ANKLE FRACTURE: SHX5408

## 2022-01-20 LAB — GLUCOSE, CAPILLARY
Glucose-Capillary: 169 mg/dL — ABNORMAL HIGH (ref 70–99)
Glucose-Capillary: 157 mg/dL — ABNORMAL HIGH (ref 70–99)
Glucose-Capillary: 311 mg/dL — ABNORMAL HIGH (ref 70–99)
Glucose-Capillary: 326 mg/dL — ABNORMAL HIGH (ref 70–99)

## 2022-01-20 SURGERY — OPEN REDUCTION INTERNAL FIXATION (ORIF) ANKLE FRACTURE
Anesthesia: General | Site: Ankle | Laterality: Left

## 2022-01-20 MED ORDER — PHENOL 1.4 % MT LIQD: 1.0000 | OROMUCOSAL | Status: DC | PRN

## 2022-01-20 MED ORDER — SUGAMMADEX SODIUM 200 MG/2ML IV SOLN
INTRAVENOUS | Status: DC | PRN
  Administered 2022-01-20: 200 mg via INTRAVENOUS

## 2022-01-20 MED ORDER — ROPIVACAINE HCL 5 MG/ML IJ SOLN: 30.0000 mL | Freq: Once | INTRAMUSCULAR | Status: DC

## 2022-01-20 MED ORDER — ROPIVACAINE HCL 5 MG/ML IJ SOLN
INTRAMUSCULAR | Status: AC
Start: 2022-01-20 — End: 2022-01-20
  Administered 2022-01-20: 30 mL via PERINEURAL
  Filled 2022-01-20: qty 30

## 2022-01-20 MED ORDER — SENNA 8.6 MG PO TABS
1.0000 | ORAL_TABLET | Freq: Two times a day (BID) | ORAL | Status: DC
  Administered 2022-01-20 – 2022-01-22 (×5): 8.6 mg via ORAL
  Filled 2022-01-20 (×6): qty 1

## 2022-01-20 MED ORDER — ENOXAPARIN SODIUM 40 MG/0.4ML IJ SOSY
40.0000 mg | PREFILLED_SYRINGE | INTRAMUSCULAR | Status: DC
  Administered 2022-01-21 – 2022-01-22 (×2): 40 mg via SUBCUTANEOUS
  Filled 2022-01-20 (×2): qty 0.4

## 2022-01-20 MED ORDER — FENTANYL CITRATE (PF) 100 MCG/2ML IJ SOLN: 25.0000 ug | INTRAMUSCULAR | Status: DC | PRN

## 2022-01-20 MED ORDER — MENTHOL 3 MG MT LOZG: 1.0000 | LOZENGE | OROMUCOSAL | Status: DC | PRN

## 2022-01-20 MED ORDER — SODIUM CHLORIDE 0.9 % IV SOLN: INTRAVENOUS | Status: DC | PRN

## 2022-01-20 MED ORDER — POLYETHYLENE GLYCOL 3350 17 G PO PACK: 17.0000 g | PACK | Freq: Every day | ORAL | Status: DC | PRN

## 2022-01-20 MED ORDER — ONDANSETRON HCL 4 MG/2ML IJ SOLN: 4.0000 mg | Freq: Once | INTRAMUSCULAR | Status: DC | PRN

## 2022-01-20 MED ORDER — ROCURONIUM BROMIDE 100 MG/10ML IV SOLN
INTRAVENOUS | Status: DC | PRN
  Administered 2022-01-20 (×2): 50 mg via INTRAVENOUS

## 2022-01-20 MED ORDER — PHENYLEPHRINE 40 MCG/ML (10ML) SYRINGE FOR IV PUSH (FOR BLOOD PRESSURE SUPPORT): PREFILLED_SYRINGE | INTRAVENOUS | Status: DC | PRN

## 2022-01-20 MED ORDER — 0.9 % SODIUM CHLORIDE (POUR BTL) OPTIME
TOPICAL | Status: DC | PRN
  Administered 2022-01-20: 1000 mL

## 2022-01-20 MED ORDER — FENTANYL CITRATE (PF) 100 MCG/2ML IJ SOLN: 100.0000 ug | Freq: Once | INTRAMUSCULAR | Status: AC

## 2022-01-20 MED ORDER — OXYCODONE HCL 5 MG PO TABS: 5.0000 mg | ORAL_TABLET | Freq: Once | ORAL | Status: DC | PRN

## 2022-01-20 MED ORDER — TRAMADOL HCL 50 MG PO TABS
50.0000 mg | ORAL_TABLET | Freq: Four times a day (QID) | ORAL | Status: DC
  Administered 2022-01-20 – 2022-01-22 (×7): 50 mg via ORAL
  Filled 2022-01-20 (×8): qty 1

## 2022-01-20 MED ORDER — MORPHINE SULFATE (PF) 2 MG/ML IV SOLN: 0.5000 mg | INTRAVENOUS | Status: DC | PRN

## 2022-01-20 MED ORDER — BUPIVACAINE HCL 0.25 % IJ SOLN
INTRAMUSCULAR | Status: DC | PRN
  Administered 2022-01-20: 30 mL

## 2022-01-20 MED ORDER — FENTANYL CITRATE (PF) 100 MCG/2ML IJ SOLN
INTRAMUSCULAR | Status: AC
  Administered 2022-01-20: 100 ug via INTRAVENOUS
  Filled 2022-01-20: qty 2

## 2022-01-20 MED ORDER — LIDOCAINE HCL (PF) 1 % IJ SOLN
INTRAMUSCULAR | Status: AC
  Administered 2022-01-20: 5 mL
  Filled 2022-01-20: qty 5

## 2022-01-20 MED ORDER — DEXAMETHASONE SODIUM PHOSPHATE 10 MG/ML IJ SOLN
INTRAMUSCULAR | Status: DC | PRN
  Administered 2022-01-20: 5 mg via INTRAVENOUS

## 2022-01-20 MED ORDER — CEFAZOLIN SODIUM-DEXTROSE 2-4 GM/100ML-% IV SOLN
2.0000 g | Freq: Four times a day (QID) | INTRAVENOUS | Status: AC
  Administered 2022-01-20 (×2): 2 g via INTRAVENOUS
  Filled 2022-01-20 (×2): qty 100

## 2022-01-20 MED ORDER — ROPIVACAINE HCL 5 MG/ML IJ SOLN
INTRAMUSCULAR | Status: DC | PRN
  Administered 2022-01-20 (×2): 10 mL via EPIDURAL
  Administered 2022-01-20: 7 mL via EPIDURAL
  Administered 2022-01-20: 10 mL via EPIDURAL

## 2022-01-20 MED ORDER — LIDOCAINE HCL (PF) 1 % IJ SOLN
INTRAMUSCULAR | Status: DC | PRN
  Administered 2022-01-20: 2 mL

## 2022-01-20 MED ORDER — PHENYLEPHRINE HCL (PRESSORS) 10 MG/ML IV SOLN
INTRAVENOUS | Status: DC | PRN
  Administered 2022-01-20 (×2): 80 ug via INTRAVENOUS

## 2022-01-20 MED ORDER — MIDAZOLAM HCL 2 MG/2ML IJ SOLN
INTRAMUSCULAR | Status: AC
  Filled 2022-01-20: qty 2

## 2022-01-20 MED ORDER — LIDOCAINE HCL (CARDIAC) PF 100 MG/5ML IV SOSY
PREFILLED_SYRINGE | INTRAVENOUS | Status: DC | PRN
  Administered 2022-01-20: 20 mg via INTRAVENOUS

## 2022-01-20 MED ORDER — ONDANSETRON HCL 4 MG PO TABS
4.0000 mg | ORAL_TABLET | Freq: Four times a day (QID) | ORAL | Status: DC | PRN
Start: 2022-01-20 — End: 2022-01-22

## 2022-01-20 MED ORDER — ONDANSETRON HCL 4 MG/2ML IJ SOLN
INTRAMUSCULAR | Status: DC | PRN
  Administered 2022-01-20 (×2): 4 mg via INTRAVENOUS

## 2022-01-20 MED ORDER — PHENYLEPHRINE HCL-NACL 20-0.9 MG/250ML-% IV SOLN
INTRAVENOUS | Status: DC | PRN
  Administered 2022-01-20: 70 ug/min via INTRAVENOUS

## 2022-01-20 MED ORDER — CEFAZOLIN SODIUM-DEXTROSE 2-4 GM/100ML-% IV SOLN
INTRAVENOUS | Status: AC
  Filled 2022-01-20: qty 100

## 2022-01-20 MED ORDER — DOCUSATE SODIUM 100 MG PO CAPS
100.0000 mg | ORAL_CAPSULE | Freq: Two times a day (BID) | ORAL | Status: DC
  Administered 2022-01-20 – 2022-01-22 (×5): 100 mg via ORAL
  Filled 2022-01-20 (×5): qty 1

## 2022-01-20 MED ORDER — ROPIVACAINE HCL 5 MG/ML IJ SOLN
INTRAMUSCULAR | Status: AC
  Filled 2022-01-20: qty 30

## 2022-01-20 MED ORDER — HYDROCODONE-ACETAMINOPHEN 7.5-325 MG PO TABS
1.0000 | ORAL_TABLET | ORAL | Status: DC | PRN
  Administered 2022-01-21: 2 via ORAL
  Administered 2022-01-22: 1 via ORAL
  Filled 2022-01-20: qty 1
  Filled 2022-01-20: qty 2

## 2022-01-20 MED ORDER — BISACODYL 10 MG RE SUPP: 10.0000 mg | Freq: Every day | RECTAL | Status: DC | PRN

## 2022-01-20 MED ORDER — NEOMYCIN-POLYMYXIN B GU 40-200000 IR SOLN
Status: DC | PRN
Start: 2022-01-20 — End: 2022-01-20
  Administered 2022-01-20: 4 mL

## 2022-01-20 MED ORDER — LIDOCAINE HCL (PF) 1 % IJ SOLN: 5.0000 mL | Freq: Once | INTRAMUSCULAR | Status: AC

## 2022-01-20 MED ORDER — OXYCODONE HCL 5 MG/5ML PO SOLN: 5.0000 mg | Freq: Once | ORAL | Status: DC | PRN

## 2022-01-20 MED ORDER — ROPIVACAINE HCL 5 MG/ML IJ SOLN
30.0000 mL | Freq: Once | INTRAMUSCULAR | Status: AC
Start: 2022-01-20 — End: 2022-01-20

## 2022-01-20 MED ORDER — HYDROCODONE-ACETAMINOPHEN 5-325 MG PO TABS
1.0000 | ORAL_TABLET | ORAL | Status: DC | PRN
  Administered 2022-01-21 (×2): 2 via ORAL
  Filled 2022-01-20 (×2): qty 2

## 2022-01-20 MED ORDER — PROPOFOL 10 MG/ML IV BOLUS
INTRAVENOUS | Status: DC | PRN
  Administered 2022-01-20: 140 mg via INTRAVENOUS

## 2022-01-20 MED ORDER — FENTANYL CITRATE (PF) 100 MCG/2ML IJ SOLN
INTRAMUSCULAR | Status: AC
  Filled 2022-01-20: qty 2

## 2022-01-20 MED ORDER — POTASSIUM CHLORIDE IN NACL 20-0.45 MEQ/L-% IV SOLN
INTRAVENOUS | Status: DC
  Filled 2022-01-20 (×3): qty 1000

## 2022-01-20 MED ORDER — ACETAMINOPHEN 500 MG PO TABS
500.0000 mg | ORAL_TABLET | Freq: Four times a day (QID) | ORAL | Status: AC
  Administered 2022-01-20 – 2022-01-21 (×4): 500 mg via ORAL
  Filled 2022-01-20 (×4): qty 1

## 2022-01-20 MED ORDER — ONDANSETRON HCL 4 MG/2ML IJ SOLN: 4.0000 mg | Freq: Four times a day (QID) | INTRAMUSCULAR | Status: DC | PRN

## 2022-01-20 MED ORDER — ALUM & MAG HYDROXIDE-SIMETH 200-200-20 MG/5ML PO SUSP: 30.0000 mL | ORAL | Status: DC | PRN

## 2022-01-20 MED ORDER — FENTANYL CITRATE (PF) 100 MCG/2ML IJ SOLN
INTRAMUSCULAR | Status: DC | PRN
  Administered 2022-01-20 (×2): 50 ug via INTRAVENOUS

## 2022-01-20 SURGICAL SUPPLY — 57 items
BIT DRILL 2.5X110 QC LCP DISP (BIT) ×1 IMPLANT
BIT DRILL CANN 2.7X625 NONSTRL (BIT) ×1 IMPLANT
BLADE SURG 15 STRL LF DISP TIS (BLADE) ×1 IMPLANT
BLADE SURG 15 STRL SS (BLADE) ×1
BNDG COHESIVE 4X5 TAN ST LF (GAUZE/BANDAGES/DRESSINGS) ×2 IMPLANT
BNDG ELASTIC 4X5.8 VLCR NS LF (GAUZE/BANDAGES/DRESSINGS) ×4 IMPLANT
BNDG ELASTIC 6X5.8 VLCR STR LF (GAUZE/BANDAGES/DRESSINGS) ×1 IMPLANT
BNDG ESMARK 6X12 TAN STRL LF (GAUZE/BANDAGES/DRESSINGS) ×2 IMPLANT
CUFF TOURN SGL QUICK 24 (TOURNIQUET CUFF)
CUFF TOURN SGL QUICK 34 (TOURNIQUET CUFF)
CUFF TRNQT CYL 24X4X16.5-23 (TOURNIQUET CUFF) IMPLANT
CUFF TRNQT CYL 34X4.125X (TOURNIQUET CUFF) IMPLANT
DRAPE FLUOR MINI C-ARM 54X84 (DRAPES) ×2 IMPLANT
DRAPE INCISE IOBAN 66X45 STRL (DRAPES) ×2 IMPLANT
DRAPE U-SHAPE 47X51 STRL (DRAPES) ×3 IMPLANT
DURAPREP 26ML APPLICATOR (WOUND CARE) ×4 IMPLANT
ELECT REM PT RETURN 9FT ADLT (ELECTROSURGICAL) ×2
ELECTRODE REM PT RTRN 9FT ADLT (ELECTROSURGICAL) ×1 IMPLANT
GAUZE SPONGE 4X4 12PLY STRL (GAUZE/BANDAGES/DRESSINGS) ×4 IMPLANT
GAUZE XEROFORM 1X8 LF (GAUZE/BANDAGES/DRESSINGS) ×3 IMPLANT
GLOVE SURG ORTHO LTX SZ9 (GLOVE) ×4 IMPLANT
GLOVE SURG UNDER POLY LF SZ9 (GLOVE) ×2 IMPLANT
GOWN STRL REUS TWL 2XL XL LVL4 (GOWN DISPOSABLE) ×2 IMPLANT
GOWN STRL REUS W/ TWL LRG LVL3 (GOWN DISPOSABLE) ×1 IMPLANT
GOWN STRL REUS W/TWL LRG LVL3 (GOWN DISPOSABLE) ×2
GUIDEWARE NON THREAD 1.25X150 (WIRE) ×6
GUIDEWIRE NON THREAD 1.25X150 (WIRE) IMPLANT
KIT TURNOVER KIT A (KITS) ×2 IMPLANT
LABEL OR SOLS (LABEL) ×2 IMPLANT
MANIFOLD NEPTUNE II (INSTRUMENTS) ×2 IMPLANT
NS IRRIG 1000ML POUR BTL (IV SOLUTION) ×2 IMPLANT
PACK EXTREMITY ARMC (MISCELLANEOUS) ×2 IMPLANT
PAD ABD DERMACEA PRESS 5X9 (GAUZE/BANDAGES/DRESSINGS) ×8 IMPLANT
PAD CAST CTTN 4X4 STRL (SOFTGOODS) ×3 IMPLANT
PADDING CAST ABS 4INX4YD NS (CAST SUPPLIES) ×3
PADDING CAST ABS COTTON 4X4 ST (CAST SUPPLIES) IMPLANT
PADDING CAST COTTON 4X4 STRL (SOFTGOODS) ×4
PLATE LCP 3.5 1/3 TUB 10HX117 (Plate) ×1 IMPLANT
SCREW CANC FT ST SFS 4X16 (Screw) ×1 IMPLANT
SCREW CANC FT/18 4.0 (Screw) ×1 IMPLANT
SCREW CANN L THRD/40 4.0 (Screw) ×2 IMPLANT
SCREW CORTEX 3.5 12MM (Screw) ×3 IMPLANT
SCREW CORTEX 3.5 14MM (Screw) ×2 IMPLANT
SCREW CORTEX 3.5 18MM (Screw) ×2 IMPLANT
SCREW LOCK CORT ST 3.5X12 (Screw) IMPLANT
SCREW LOCK CORT ST 3.5X14 (Screw) IMPLANT
SCREW LOCK CORT ST 3.5X18 (Screw) IMPLANT
SPLINT CAST 1 STEP 4X30 (MISCELLANEOUS) ×4 IMPLANT
SPONGE T-LAP 18X18 ~~LOC~~+RFID (SPONGE) ×2 IMPLANT
STAPLER SKIN PROX 35W (STAPLE) ×2 IMPLANT
STOCKINETTE STRL 6IN 960660 (GAUZE/BANDAGES/DRESSINGS) ×2 IMPLANT
SUT VIC AB 2-0 SH 27 (SUTURE) ×2
SUT VIC AB 2-0 SH 27XBRD (SUTURE) ×2 IMPLANT
SYR 30ML LL (SYRINGE) ×2 IMPLANT
TAPE PAPER 2X10 WHT MICROPORE (GAUZE/BANDAGES/DRESSINGS) ×2 IMPLANT
WATER STERILE IRR 1000ML POUR (IV SOLUTION) ×1 IMPLANT
WATER STERILE IRR 500ML POUR (IV SOLUTION) ×2 IMPLANT

## 2022-01-20 NOTE — Transfer of Care (Signed)
Immediate Anesthesia Transfer of Care Note ? ?Patient: Craig Donaldson ? ?Procedure(s) Performed: OPEN REDUCTION INTERNAL FIXATION (ORIF) ANKLE FRACTURE (Left: Ankle) ? ?Patient Location: PACU ? ?Anesthesia Type:General ? ?Level of Consciousness: sedated ? ?Airway & Oxygen Therapy: Patient Spontanous Breathing and Patient connected to face mask oxygen ? ?Post-op Assessment: Report given to RN and Post -op Vital signs reviewed and stable ? ?Post vital signs: Reviewed and stable ? ?Last Vitals:  ?Vitals Value Taken Time  ?BP 100/64 01/20/22 1315  ?Temp 37 ?C 01/20/22 1314  ?Pulse 65 01/20/22 1317  ?Resp 10 01/20/22 1317  ?SpO2 98 % 01/20/22 1317  ?Vitals shown include unvalidated device data. ? ?Last Pain:  ?Vitals:  ? 01/20/22 1314  ?TempSrc:   ?PainSc: Asleep  ?   ? ?  ? ?Complications: No notable events documented. ?

## 2022-01-20 NOTE — Plan of Care (Signed)

## 2022-01-20 NOTE — Progress Notes (Signed)
[facility] ? ? ?PATIENT NAME: Craig Donaldson   ? ?MR#:  [id number] ? ?DATE OF BIRTH:  [birth year] ? ?SUBJECTIVE:  ? ?patient came in after a fall while cutting trees he heard a pop. Found to have left ankle fracture. Patient is scheduled for surgery. Wife at bedside. ? ? ?VITALS:  ?Blood pressure 107/78, pulse 67, temperature 97.8 ?F (36.6 ?C), resp. rate 18, height 5\' 5"  (1.651 m), weight 86.2 kg, SpO2 95 %. ? ?PHYSICAL EXAMINATION:  ? ?GENERAL:  71 y.o.-year-old patient lying in the bed with no acute distress.  ?LUNGS: Normal breath sounds bilaterally, no wheezing, rales, rhonchi.  ?CARDIOVASCULAR: S1, S2 normal. No murmurs, rubs, or gallops.  ?ABDOMEN: Soft, nontender, nondistended. Bowel sounds present.  ?EXTREMITIES: no leg edema ?NEUROLOGIC: nonfocal  patient is alert and awake ?SKIN: No obvious rash, lesion, or ulcer.  ? ?LABORATORY PANEL:  ?CBC ?Recent Labs  ?Lab 01/19/22 ?1359  ?WBC 13.8*  ?HGB 12.7*  ?HCT 40.4  ?PLT 406*  ? ? ?Chemistries  ?Recent Labs  ?Lab 01/19/22 ?1420  ?NA 138  ?K 4.0  ?CL 105  ?CO2 25  ?GLUCOSE 137*  ?BUN 34*  ?CREATININE 1.59*  ?CALCIUM 9.5  ?AST 22  ?ALT 19  ?ALKPHOS 29*  ?BILITOT 0.6  ? ?Cardiac Enzymes ?No results for input(s): TROPONINI in the last 168 hours. ?RADIOLOGY:  ?DG Ankle Complete Left ? ?Result Date: 01/20/2022 ?CLINICAL DATA:  Left ankle fracture.  Status post ORIF. EXAM: LEFT ANKLE COMPLETE - 3+ VIEW COMPARISON:  01/19/2022 FINDINGS: Three view study. Fine detail obscured by overlying fiberglass splint. Patient is status post lateral plate and screw fixation of the distal fibula fracture with screw fixation of the medial malleolar fracture fragment. Bony alignment improved compared to the pre reduction films. No evidence for immediate hardware complication. IMPRESSION: Status post ORIF for distal fibula and medial malleolar fractures. No evidence for immediate hardware complications. Electronically Signed   By: Misty Stanley  M.D.   On: 01/20/2022 13:46  ? ?DG Ankle Complete Left ? ?Result Date: 01/19/2022 ?CLINICAL DATA:  Fall, injury to ankle. EXAM: LEFT ANKLE COMPLETE - 3+ VIEW COMPARISON:  None. FINDINGS: Displaced fractures of the medial malleolus and distal fibula. Ankle mortise remains grossly symmetric. No fracture is seen within the underlying talus or hindfoot. Soft tissue swelling overlying the medial malleolus. IMPRESSION: Displaced fractures of the medial malleolus and distal fibula. Associated soft tissue swelling. Electronically Signed   By: Franki Cabot M.D.   On: 01/19/2022 13:04  ? ?DG Chest Port 1 View ? ?Result Date: 01/19/2022 ?CLINICAL DATA:  Preoperative respiratory evaluation for left ankle surgery. EXAM: PORTABLE CHEST 1 VIEW COMPARISON:  10/29/2007 FINDINGS: 1425 hours. The lungs are clear without focal pneumonia, edema, pneumothorax or pleural effusion. The cardiopericardial silhouette is within normal limits for size. The visualized bony structures of the thorax are unremarkable. IMPRESSION: No active disease. Electronically Signed   By: Misty Stanley M.D.   On: 01/19/2022 14:41  ? ?[place] OR NERVE BLOCK-IMAGE ONLY [facility]) ? ?Result Date: 01/20/2022 ?There is no interpretation for this exam.  This order is for images obtained during a surgical procedure.  Please See "Surgeries" Tab for more information regarding the procedure.  ? ?DG MINI C-ARM IMAGE ONLY ? ?Result Date: 01/20/2022 ?There is no interpretation for this exam.  This order is for images obtained during a surgical procedure.  Please See "Surgeries" Tab for more information regarding the procedure.   ? ?Assessment  and Plan ?Craig Donaldson is a 71 y.o. male with medical history significant of hypertension, hyperlipidemia, diabetes mellitus, asthma, left ureteral calculus, CKD-IIIa, who presents with fall and left ankle pain. ? Pt states that he fell when he was cutting down some trees this AM, but slipped in some wet grass and mud.  He injured his left ankle,  developed left ankle pain, which is constant, sharp, moderate to severe, nonradiating. ? ?Closed left ankle fracture: X-ray of left ankle showed left medial malleolus fracture and left distal fibula fracture. ?-- Dr. Mack Guise of Ortho is consulted--plans for sugery today ?- Pain control: morphine prn and Norco ?- When necessary Zofran for nausea ?- Robaxin for muscle spasm ?--PT/OT after surgery ?  ?Leukocytosis: WBC 13.8. Likely reactive. Patient does not have signs of infection. ?  ?Hypertension: ?-Lotensin-HCTZ, metoprolol ?-IV hydralazine as needed ?  ?Hyperlipidemia ?-Fenofibrate, Lipitor ?  ?History of asthma: Stable ?-As needed albuterol ?  ?Diabetes mellitus without complication ([place]):  ?--Recent A1c 7.3.  Poorly controlled.   ?--Patient still taking Tradjenta, metformin and Amaryl, Toujeo insulin 66 units daily--will start home meds after surgery ?-Sliding scale insulin ?  ?Fall ?-PT/OT from tomorrow ?  ?Chronic kidney disease, stage 3a ([place]): Stable.  Recent baseline creatinine 1.6-1.7.  His creatinine is 1.59, BUN 34 ? ? ?Family communication :wife at bedside ?Consults :Orthopedic ?CODE STATUS: Full ?DVT Prophylaxis : Lovenox ?Level of care: Med-Surg ?Status is: Inpatient ?Remains inpatient appropriate because:  ?  ? ?TOTAL TIME TAKING CARE OF THIS PATIENT: 25 minutes.  ?>50% time spent on counselling and coordination of care ? ?Note: This dictation was prepared with Dragon dictation along with smaller phrase technology. Any transcriptional errors that result from this process are unintentional. ? ?Fritzi Mandes M.D  ? ? ?[facility]  ? ?CC: ?Primary care physician; Dion Body, MD  ?

## 2022-01-20 NOTE — Anesthesia Procedure Notes (Addendum)
Anesthesia Regional Block: Popliteal block  ? ?Pre-Anesthetic Checklist: , timeout performed,  Correct Patient, Correct Site, Correct Laterality,  Correct Procedure, Correct Position, site marked,  Risks and benefits discussed,  Surgical consent,  Pre-op evaluation,  At surgeon's request and post-op pain management ? ?Laterality: Lower and Left ? ?Prep: chloraprep     ?  ?Needles:  ?Injection technique: Single-shot ? ?Needle Type: Echogenic Needle   ? ? ?Needle Length: 9cm  ?Needle Gauge: 21  ? ? ? ?Additional Needles: ? ? ?Procedures:,,,, ultrasound used (permanent image in chart),,    ?Narrative:  ?Start time: 01/20/2022 10:06 AM ?End time: 01/20/2022 10:12 AM ?Injection made incrementally with aspirations every 5 mL. ? ?Performed by: Personally  ?Anesthesiologist: Tera Mater, MD ? ?Additional Notes: ?Patient consented for risk and benefits of nerve block including but not limited to nerve damage, failed block, bleeding and infection.  Patient voiced understanding. ? ?Functioning IV was confirmed and monitors were applied.  Timeout done prior to procedure and prior to any sedation being given to the patient.  Patient confirmed procedure site prior to any sedation given to the patient.  A 21mm 22ga Stimuplex needle was used. Sterile prep,hand hygiene and sterile gloves were used.  Minimal sedation used for procedure.  No paresthesia endorsed by patient during the procedure.  Negative aspiration and negative test dose prior to incremental administration of local anesthetic. The patient tolerated the procedure well with no immediate complications. ? ?30 mL ropivacaine 0.5% ? ? ? ?

## 2022-01-20 NOTE — Anesthesia Postprocedure Evaluation (Signed)
Anesthesia Post Note ? ?Patient: MAVERIK FOOT ? ?Procedure(s) Performed: OPEN REDUCTION INTERNAL FIXATION (ORIF) ANKLE FRACTURE (Left: Ankle) ? ?Patient location during evaluation: PACU ?Anesthesia Type: General ?Level of consciousness: awake and alert ?Pain management: pain level controlled ?Vital Signs Assessment: post-procedure vital signs reviewed and stable ?Respiratory status: spontaneous breathing, nonlabored ventilation, respiratory function stable and patient connected to nasal cannula oxygen ?Cardiovascular status: blood pressure returned to baseline and stable ?Postop Assessment: no apparent nausea or vomiting ?Anesthetic complications: no ? ? ?No notable events documented. ? ? ?Last Vitals:  ?Vitals:  ? 01/20/22 1407 01/20/22 1420  ?BP: 105/67 107/78  ?Pulse: 69 67  ?Resp: 13 18  ?Temp: 36.7 ?C 36.6 ?C  ?SpO2: 95% 95%  ?  ?Last Pain:  ?Vitals:  ? 01/20/22 1407  ?TempSrc:   ?PainSc: 0-No pain  ? ? ?  ?  ?  ?  ?  ?  ? ?Karleen Hampshire ? ? ? ? ?

## 2022-01-20 NOTE — Anesthesia Procedure Notes (Signed)
Procedure Name: Intubation ?Date/Time: 01/20/2022 11:05 AM ?Performed by: Nelda Marseille, CRNA ?Pre-anesthesia Checklist: Patient identified, Patient being monitored, Timeout performed, Emergency Drugs available and Suction available ?Patient Re-evaluated:Patient Re-evaluated prior to induction ?Oxygen Delivery Method: Circle system utilized ?Preoxygenation: Pre-oxygenation with 100% oxygen ?Induction Type: IV induction ?Ventilation: Mask ventilation without difficulty ?Laryngoscope Size: Mac, 3 and McGraph ?Grade View: Grade I ?Tube type: Oral ?Tube size: 7.5 mm ?Number of attempts: 1 ?Airway Equipment and Method: Stylet ?Placement Confirmation: ETT inserted through vocal cords under direct vision, positive ETCO2 and breath sounds checked- equal and bilateral ?Secured at: 22 cm ?Tube secured with: Tape ?Dental Injury: Teeth and Oropharynx as per pre-operative assessment  ? ? ? ? ?

## 2022-01-20 NOTE — Anesthesia Procedure Notes (Addendum)
Anesthesia Regional Block: Adductor canal block  ? ?Pre-Anesthetic Checklist: , timeout performed,  Correct Patient, Correct Site, Correct Laterality,  Correct Procedure, Correct Position, site marked,  Risks and benefits discussed,  Surgical consent,  Pre-op evaluation,  At surgeon's request and post-op pain management ? ?Laterality: Lower and Left ? ?Prep: chloraprep     ?  ?Needles:  ?Injection technique: Single-shot ? ?Needle Type: Echogenic Needle   ? ? ?Needle Length: 9cm  ?Needle Gauge: 21  ? ? ? ?Additional Needles: ? ? ?Procedures:,,,, ultrasound used (permanent image in chart),,    ?Narrative:  ?Start time: 01/20/2022 10:15 AM ?End time: 01/20/2022 10:20 AM ?Injection made incrementally with aspirations every 5 mL. ? ?Performed by: Personally  ?Anesthesiologist: Tera Mater, MD ? ?Additional Notes: ?Patient consented for risk and benefits of nerve block including but not limited to nerve damage, failed block, bleeding and infection.  Patient voiced understanding. ? ?Functioning IV was confirmed and monitors were applied.  Timeout done prior to procedure and prior to any sedation being given to the patient.  Patient confirmed procedure site prior to any sedation given to the patient.  A 79mm 22ga Stimuplex needle was used. Sterile prep,hand hygiene and sterile gloves were used.  Minimal sedation used for procedure.  No paresthesia endorsed by patient during the procedure.  Negative aspiration and negative test dose prior to incremental administration of local anesthetic. The patient tolerated the procedure well with no immediate complications. ? ?7 mL ropivacaine 0.5% ? ? ? ?

## 2022-01-20 NOTE — Op Note (Signed)
01/20/2022 ? ?1:33 PM ? ?PATIENT:  Craig Donaldson   ? ?PRE-OPERATIVE DIAGNOSIS:  Left trimalleolar ankle fracture ? ?POST-OPERATIVE DIAGNOSIS:  Same ? ?PROCEDURE:  OPEN REDUCTION INTERNAL FIXATION (ORIF) LEFT TRIMALLEOLAR ANKLE FRACTURE ? ?SURGEON:  Thornton Park, MD ? ?ANESTHESIA:   General ? ?EBL:  10 cc ? ?TOURNIQUET TIME:  105 minutes ? ?PREOPERATIVE INDICATIONS:  Craig Donaldson is a  71 y.o. male with a diagnosis of Left bimalleolar ankle fracture.  Given the displacement and unstable fracture pattern, he was recommended for open reduction internal fixation.  I discussed the risks and benefits of surgery. The risks include but are not limited to infection, bleeding requiring blood transfusion, nerve or blood vessel injury, joint stiffness or loss of motion, persistent pain, weakness or instability, malunion, nonunion and hardware failure and the need for further surgery. Patient understood these risks and wished to proceed.  ? ?OPERATIVE IMPLANTS: Synthes 10 hole one third tubular plate with 4.0 fully threaded cancellous and 3.5 mm bicortical screws for lateral fixation.  Synthes 4.0 mm x 34m cannulated screws x2. ? ?OPERATIVE FINDINGS: Comminuted fracture of the distal fibula with posterior angulation and displaced medial malleolus fracture ? ?OPERATIVE PROCEDURE:   Patient was met in the preoperative area. The left leg was signed my initials and the word yes according the hospital's correct site of surgery protocol.  Patient underwent placement of a popliteal block in the preoperative area by the anesthesiologist.  The patient was then brought to the operating room where he underwent general anesthesia. The patient was placed supine on the operative table. A bump was placed under the left hip. A tourniquet was applied to the left thigh.  The lower extremity was prepped and draped in a sterile fashion. A timeout was performed to verify the patient's name, date of birth, medical record number, correct site  of surgery and correct procedure to be performed. It was also used to verify the patient received antibiotics, and that all appropriate instruments, implants and radiographic studies were available in the room. Once all in attendance were in agreement, the case began. ? ?The left lower extremity was exsanguinated with an Esmarch. The tourniquet was inflated to 275 mmHg. This was applied for a total of 105 minutes. A lateral incision was made over the fibula. The subcutaneous tissues were dissected with the Metzenbaum scissor and pickup. Care was taken to avoid injury to the superficial peroneal nerve. The lateral malleolus fracture was identified and irrigated and fracture hematoma was removed. Soft tissue was removed from the fracture site using a periosteal elevator. A fracture reduction clamp was then used to reduce the fracture to an anatomic position. ?    ?The lateral malleolus was then drilled in an AP direction, perpendicular to the fracture site to allow for placement of the lag screw.   A single lag screw, 18 mm in length, was advanced across the fracture site by hand. This compressed the fracture.   A 10 hole, 1/3 tubular plate was then contoured and placed along the lateral fibula. Bicortical screws were placed proximal to the fracture and fully threaded cancellus screws were placed distal the fracture. The fracture reduction and hardware placement were confirmed on AP and lateral imaging. ? ?Once the lateral malleolus was plated, the attention was turned to the medial ankle. A small vertical incision was made over the tip of the medial malleolus.  Soft tissue was dissected with some with the Metzenbaum scissor and pickup. The fracture of the medial  malleolus was identified. This was reduced with a dental pick. 2 threaded K wires for the 4.0 cannulated screws were then advanced through the tip of the medial malleolus across the fracture site and into the distal tibia. The position of the K wires was  evaluated on AP and lateral FluoroScan images. The length of the wires were measured with a depth gauge and were determined to both be 40 mm in length. The wires were then overdrilled with a cannulated drill for the 4.0 cannulated screws. The two long threaded 4.0 cannulated screws were then advanced into position by hand, compressing the medial malleolus fracture.   ? ?The small posterior malleolus was then examined under fluoroscopy.  The posterior malleolus fracture only involves approximately 10 % of the articular surface and was in a near anatomic position. The decision was made made not to place an AP screw given its stability and small size.  A stress test of the right ankle was then performed under fluoroscopy.  This test did not reveal any syndesmotic injury or opening of the medial clear space. ? ?The medial and lateral incisions were then copiously irrigated. The subcutaneous tissue was closed with 2-0 Vicryl and the skin approximated staples.  30 cc of 0.25% Marcaine plain was injected intra-articularly into the left ankle as well as at the incision sites.  A dry sterile dressing was applied along with an AO splint. The patient's ankle was positioned in neutral. The pateint was then awoken from anesthesia, transferred to hospital bed and brought to the PACU in stable condition. I was scrubbed and present the entire case and all sharp and instrument counts were correct at conclusion the case.  ? ? ?Timoteo Gaul, MD  ?

## 2022-01-20 NOTE — Anesthesia Preprocedure Evaluation (Addendum)
Anesthesia Evaluation  ?Patient identified by MRN, date of birth, ID band ?Patient awake ? ? ? ?Reviewed: ?Allergy & Precautions, H&P , NPO status , Patient's Chart, lab work & pertinent test results ? ?History of Anesthesia Complications ?Negative for: history of anesthetic complications ? ?Airway ?Mallampati: II ? ?TM Distance: >3 FB ?Neck ROM: Full ? ? ? Dental ? ?(+)  ?  ?Pulmonary ?asthma , neg sleep apnea, neg COPD,  ?  ?breath sounds clear to auscultation ? ? ? ? ? ? Cardiovascular ?hypertension, (-) angina(-) Past MI and (-) Cardiac Stents (-) dysrhythmias  ?Rhythm:regular Rate:Normal ? ? ?  ?Neuro/Psych ?negative neurological ROS ? negative psych ROS  ? GI/Hepatic ?negative GI ROS, Neg liver ROS,   ?Endo/Other  ?diabetes ? Renal/GU ?Renal disease (CKD)  ? ?  ?Musculoskeletal ?XR ankle ?IMPRESSION: ?Displaced fractures of the medial malleolus and distal fibula. ?Associated soft tissue swelling. ? ?S/f ankle ORIF  ? Abdominal ?  ?Peds ? Hematology ?negative hematology ROS ?(+)   ?Anesthesia Other Findings ?Past Medical History: ?No date: Chronic kidney disease, stage 3a ([place]) ?No date: History of asthma ?    Comment:  NO ISSUES SINCE THE 1990'S ?No date: Hyperlipidemia ?No date: Hypertension ?No date: Left ureteral calculus ?No date: Type 2 diabetes mellitus ([place]) ? ?Past Surgical History: ?06/04/2013: CYSTOSCOPY WITH RETROGRADE PYELOGRAM, URETEROSCOPY AND  ?STENT PLACEMENT; Left ?    Comment:  Procedure: CYSTOSCOPY WITH RETROGRADE PYELOGRAM,  ?             URETEROSCOPY AND STENT PLACEMENT;  Surgeon: Kirtland Bouchard  ?             Nesi, MD;  Location: [facility];   ?             Service: Urology;  Laterality: Left;  1 HR ? ?06/04/2013: HOLMIUM LASER APPLICATION; Left ?    Comment:  Procedure: HOLMIUM LASER APPLICATION;  Surgeon:  ?             Hanley Ben, MD;  Location: [facility]  ?             CENTER;  Service: Urology;  Laterality: Left; ? ?BMI   ?  Body Mass Index: 31.62 kg/m?  ?  ? ? Reproductive/Obstetrics ?negative OB ROS ? ?  ? ? ? ? ? ? ? ? ? ? ? ? ? ?  ?  ? ? ? ? ? ? ? ?Anesthesia Physical ?Anesthesia Plan ? ?ASA: 3 ? ?Anesthesia Plan: General ETT  ? ?Post-op Pain Management: Regional block*  ? ?Induction: Intravenous ? ?PONV Risk Score and Plan: Ondansetron, Dexamethasone and Treatment may vary due to age or medical condition ? ?Airway Management Planned: Oral ETT ? ?Additional Equipment:  ? ?Intra-op Plan:  ? ?Post-operative Plan:  ? ?Informed Consent: I have reviewed the patients History and Physical, chart, labs and discussed the procedure including the risks, benefits and alternatives for the proposed anesthesia with the patient or authorized representative who has indicated his/her understanding and acceptance.  ? ? ? ?Dental Advisory Given ? ?Plan Discussed with: Anesthesiologist, CRNA and Surgeon ? ?Anesthesia Plan Comments:   ? ? ? ? ?Anesthesia Quick Evaluation ? ? ?Pre-op pop/saph blocks ?GETA ?Standard monitors ?NPO appropriate ?Risks/benefits discussed and pt agrees to proceed with anesthetic. ? ?K Ricca Melgarejo ?

## 2022-01-20 NOTE — Progress Notes (Addendum)
The patient has been re-examined, and the chart reviewed, and there have been no interval changes to the documented history and physical.   ? ?Patient's right lower extremity was marked according to hospital's correct site of surgery protocol.  The patient is receiving a popliteal block by the anesthesia service in the preoperative area.  Patient elected to proceed with a popliteal block for assistance with postoperative pain control. ? ?The risks, benefits, and alternatives have been discussed at length, and the patient is willing to proceed.    ?

## 2022-01-21 DIAGNOSIS — E785 Hyperlipidemia, unspecified: Secondary | ICD-10-CM | POA: Diagnosis not present

## 2022-01-21 DIAGNOSIS — E119 Type 2 diabetes mellitus without complications: Secondary | ICD-10-CM | POA: Diagnosis not present

## 2022-01-21 DIAGNOSIS — I10 Essential (primary) hypertension: Secondary | ICD-10-CM | POA: Diagnosis not present

## 2022-01-21 DIAGNOSIS — S82899A Other fracture of unspecified lower leg, initial encounter for closed fracture: Secondary | ICD-10-CM | POA: Diagnosis present

## 2022-01-21 DIAGNOSIS — S82892A Other fracture of left lower leg, initial encounter for closed fracture: Secondary | ICD-10-CM | POA: Diagnosis not present

## 2022-01-21 LAB — GLUCOSE, CAPILLARY
Glucose-Capillary: 298 mg/dL — ABNORMAL HIGH (ref 70–99)
Glucose-Capillary: 255 mg/dL — ABNORMAL HIGH (ref 70–99)
Glucose-Capillary: 281 mg/dL — ABNORMAL HIGH (ref 70–99)
Glucose-Capillary: 304 mg/dL — ABNORMAL HIGH (ref 70–99)

## 2022-01-21 MED ORDER — INSULIN GLARGINE-YFGN 100 UNIT/ML ~~LOC~~ SOLN
35.0000 [IU] | Freq: Every day | SUBCUTANEOUS | Status: DC
  Administered 2022-01-22: 35 [IU] via SUBCUTANEOUS
  Filled 2022-01-21: qty 0.35

## 2022-01-21 MED ORDER — METFORMIN HCL 500 MG PO TABS
1000.0000 mg | ORAL_TABLET | Freq: Two times a day (BID) | ORAL | Status: DC
  Administered 2022-01-22: 1000 mg via ORAL
  Filled 2022-01-21: qty 2

## 2022-01-21 MED ORDER — LINAGLIPTIN 5 MG PO TABS
5.0000 mg | ORAL_TABLET | Freq: Every day | ORAL | Status: DC
  Filled 2022-01-21: qty 1

## 2022-01-21 MED ORDER — GLIMEPIRIDE 4 MG PO TABS
4.0000 mg | ORAL_TABLET | Freq: Two times a day (BID) | ORAL | Status: DC
  Administered 2022-01-21 – 2022-01-22 (×2): 4 mg via ORAL
  Filled 2022-01-21 (×3): qty 1

## 2022-01-21 NOTE — Progress Notes (Signed)
?Subjective: ? ?POD #1 s/p ORIF of left trimalleolar ankle fracture.   Patient reports left ankle pain as moderate.  Patient's popliteal block is worn off and he has had increasing pain today.  He was able to perform PT and OT today.  His wife is at the bedside. ? ?Objective:  ? ?VITALS:   ?Vitals:  ? 01/20/22 1821 01/20/22 2100 01/21/22 0400 01/21/22 0809  ?BP: 100/67 123/65 126/73 117/72  ?Pulse: 77 73 63 66  ?Resp: 16   18  ?Temp: 98.6 ?F (37 ?C) 97.6 ?F (36.4 ?C) 98 ?F (36.7 ?C) 98.4 ?F (36.9 ?C)  ?TempSrc:  Oral Oral   ?SpO2: 94% 94% 95% 95%  ?Weight:      ?Height:      ? ? ?PHYSICAL EXAM: ?Left lower extremity: ?Patient can flex and extend his toes.  His toes are well-perfused.  He has intact sensation to light touch in all toes of the left foot. ?Neurovascular intact ?Sensation intact distally ?Compartment soft ? ?LABS ? ?Results for orders placed or performed during the hospital encounter of 01/19/22 (from the past 24 hour(s))  ?Glucose, capillary     Status: Abnormal  ? Collection Time: 01/20/22  6:11 PM  ?Result Value Ref Range  ? Glucose-Capillary 311 (H) 70 - 99 mg/dL  ?Glucose, capillary     Status: Abnormal  ? Collection Time: 01/20/22  8:57 PM  ?Result Value Ref Range  ? Glucose-Capillary 326 (H) 70 - 99 mg/dL  ? Comment 1 Notify RN   ?Glucose, capillary     Status: Abnormal  ? Collection Time: 01/21/22  8:48 AM  ?Result Value Ref Range  ? Glucose-Capillary 298 (H) 70 - 99 mg/dL  ?Glucose, capillary     Status: Abnormal  ? Collection Time: 01/21/22 11:24 AM  ?Result Value Ref Range  ? Glucose-Capillary 255 (H) 70 - 99 mg/dL  ? ? ?DG Ankle Complete Left ? ?Result Date: 01/20/2022 ?CLINICAL DATA:  Left ankle fracture.  Status post ORIF. EXAM: LEFT ANKLE COMPLETE - 3+ VIEW COMPARISON:  01/19/2022 FINDINGS: Three view study. Fine detail obscured by overlying fiberglass splint. Patient is status post lateral plate and screw fixation of the distal fibula fracture with screw fixation of the medial malleolar  fracture fragment. Bony alignment improved compared to the pre reduction films. No evidence for immediate hardware complication. IMPRESSION: Status post ORIF for distal fibula and medial malleolar fractures. No evidence for immediate hardware complications. Electronically Signed   By: Misty Stanley M.D.   On: 01/20/2022 13:46  ? ?DG Chest Port 1 View ? ?Result Date: 01/19/2022 ?CLINICAL DATA:  Preoperative respiratory evaluation for left ankle surgery. EXAM: PORTABLE CHEST 1 VIEW COMPARISON:  10/29/2007 FINDINGS: 1425 hours. The lungs are clear without focal pneumonia, edema, pneumothorax or pleural effusion. The cardiopericardial silhouette is within normal limits for size. The visualized bony structures of the thorax are unremarkable. IMPRESSION: No active disease. Electronically Signed   By: Misty Stanley M.D.   On: 01/19/2022 14:41  ? ?[place] OR NERVE BLOCK-IMAGE ONLY [facility]) ? ?Result Date: 01/20/2022 ?There is no interpretation for this exam.  This order is for images obtained during a surgical procedure.  Please See "Surgeries" Tab for more information regarding the procedure.  ? ?DG MINI C-ARM IMAGE ONLY ? ?Result Date: 01/20/2022 ?There is no interpretation for this exam.  This order is for images obtained during a surgical procedure.  Please See "Surgeries" Tab for more information regarding the procedure.   ? ?Assessment/Plan: ?1 Day  Post-Op  ? ?Principal Problem: ?  Closed left ankle fracture ?Active Problems: ?  Hypertension ?  Hyperlipidemia ?  History of asthma ?  Diabetes mellitus without complication ([place]) ?  Fall ?  Chronic kidney disease, stage 3a ([place]) ?  Leukocytosis ?  Ankle fracture ? ?Patient is stable postop.  He will continue to elevate the left lower extremity whenever possible.  He will continue PT and OT.  Patient will remain overnight in the hospital for continued pain control and neurovascular monitoring.  Patient is nonweightbearing on the left lower extremity for a total of 6 to 8 weeks postop.   Patient should be discharged on enteric-coated aspirin 325 mg p.o. twice daily until follow-up. ? ? ? ?Thornton Park , MD ?01/21/2022, 2:30 PM ? ? ? ? ?  ?

## 2022-01-21 NOTE — Progress Notes (Signed)
[facility] ? ? ?PATIENT NAME: Craig Donaldson   ? ?MR#:  [id number] ? ?DATE OF BIRTH:  07-20-1951 ? ?SUBJECTIVE:  ? ?patient came in after a fall while cutting trees he heard a pop. Found to have left ankle fracture. Patient is scheduled for surgery. Wife at bedside. ? ?POD #1 ORIF Left Trimalleolar fracture ?patient overall doing well. Worked with physical therapy. ?VITALS:  ?Blood pressure 117/72, pulse 66, temperature 98.4 ?F (36.9 ?C), resp. rate 18, height 5\' 5"  (1.651 m), weight 86.2 kg, SpO2 95 %. ? ?PHYSICAL EXAMINATION:  ? ?GENERAL:  71 y.o.-year-old patient lying in the bed with no acute distress.  ?LUNGS: Normal breath sounds bilaterally, no wheezing, rales, rhonchi.  ?CARDIOVASCULAR: S1, S2 normal. No murmurs, rubs, or gallops.  ?ABDOMEN: Soft, nontender, nondistended. Bowel sounds present.  ?EXTREMITIES:surgical dressing + ?NEUROLOGIC: nonfocal  patient is alert and awake ?SKIN: No obvious rash, lesion, or ulcer.  ? ?LABORATORY PANEL:  ?CBC ?Recent Labs  ?Lab 01/19/22 ?1359  ?WBC 13.8*  ?HGB 12.7*  ?HCT 40.4  ?PLT 406*  ? ? ? ?Chemistries  ?Recent Labs  ?Lab 01/19/22 ?1420  ?NA 138  ?K 4.0  ?CL 105  ?CO2 25  ?GLUCOSE 137*  ?BUN 34*  ?CREATININE 1.59*  ?CALCIUM 9.5  ?AST 22  ?ALT 19  ?ALKPHOS 29*  ?BILITOT 0.6  ? ? ?Cardiac Enzymes ?No results for input(s): TROPONINI in the last 168 hours. ?RADIOLOGY:  ?DG Ankle Complete Left ? ?Result Date: 01/20/2022 ?CLINICAL DATA:  Left ankle fracture.  Status post ORIF. EXAM: LEFT ANKLE COMPLETE - 3+ VIEW COMPARISON:  01/19/2022 FINDINGS: Three view study. Fine detail obscured by overlying fiberglass splint. Patient is status post lateral plate and screw fixation of the distal fibula fracture with screw fixation of the medial malleolar fracture fragment. Bony alignment improved compared to the pre reduction films. No evidence for immediate hardware complication. IMPRESSION: Status post ORIF for distal fibula and medial malleolar  fractures. No evidence for immediate hardware complications. Electronically Signed   By: Misty Stanley M.D.   On: 01/20/2022 13:46  ? ?DG Chest Port 1 View ? ?Result Date: 01/19/2022 ?CLINICAL DATA:  Preoperative respiratory evaluation for left ankle surgery. EXAM: PORTABLE CHEST 1 VIEW COMPARISON:  10/29/2007 FINDINGS: 1425 hours. The lungs are clear without focal pneumonia, edema, pneumothorax or pleural effusion. The cardiopericardial silhouette is within normal limits for size. The visualized bony structures of the thorax are unremarkable. IMPRESSION: No active disease. Electronically Signed   By: Misty Stanley M.D.   On: 01/19/2022 14:41  ? ?[place] OR NERVE BLOCK-IMAGE ONLY [facility]) ? ?Result Date: 01/20/2022 ?There is no interpretation for this exam.  This order is for images obtained during a surgical procedure.  Please See "Surgeries" Tab for more information regarding the procedure.  ? ?DG MINI C-ARM IMAGE ONLY ? ?Result Date: 01/20/2022 ?There is no interpretation for this exam.  This order is for images obtained during a surgical procedure.  Please See "Surgeries" Tab for more information regarding the procedure.   ? ?Assessment and Plan ?[name] is a 71 y.o. male with medical history significant of hypertension, hyperlipidemia, diabetes mellitus, asthma, left ureteral calculus, CKD-IIIa, who presents with fall and left ankle pain. ? Pt states that he fell when he was cutting down some trees this AM, but slipped in some wet grass and mud.  He injured his left ankle, developed left ankle pain, which is constant, sharp, moderate to severe, nonradiating. ? ?  Closed left ankle fracture: X-ray of left ankle showed left medial malleolus fracture and left distal fibula fracture. ?-- Dr. Mack Guise of Ortho is consulted--plans for sugery today ?- Pain control: morphine prn and Norco ?- When necessary Zofran for nausea ?- Robaxin for muscle spasm ?--PT/OT after surgery ?--4/10-- postop day one. Overall doing well.  Worked with physical therapy recommends home health. ?  ?Leukocytosis: WBC 13.8. Likely reactive. Patient does not have signs of infection. ?  ?Hypertension: ?-Lotensin-HCTZ, metoprolol ?-IV hydralazine as needed ?  ?Hyperlipidemia ?-Fenofibrate, Lipitor ?  ?History of asthma: Stable ?-As needed albuterol ?  ?Diabetes mellitus without complication ([place]):  ?--Recent A1c 7.3.  Poorly controlled.   ?--Patient still taking Tradjenta, metformin and Amaryl, Toujeo insulin 66 units daily-- resumed home meds. Increase insulin to 35 units. Patient can return back to his home dose at discharge of sugar continues to remain up. ?-Sliding scale insulin ?   ?Chronic kidney disease, stage 3a ([place]): Stable.  Recent baseline creatinine 1.6-1.7.  His creatinine is 1.59, BUN 34 ? ?overall improving. If sugars are better and patient does well with PT will discharged home tomorrow. Patient and wife agreeable. ? ?Family communication :wife at bedside ?Consults :Orthopedic ?CODE STATUS: Full ?DVT Prophylaxis : Lovenox ?Level of care: Med-Surg ? ?postop day one work with PT. Sugars bit elevated. Resume home meds and increased insulin. Sugars remain stable patient can discharge tomorrow. ?  ? ?TOTAL TIME TAKING CARE OF THIS PATIENT: 25 minutes.  ?>50% time spent on counselling and coordination of care ? ?Note: This dictation was prepared with Dragon dictation along with smaller phrase technology. Any transcriptional errors that result from this process are unintentional. ? ?Fritzi Mandes M.D  ? ? ?[facility]  ? ?CC: ?Primary care physician; Dion Body, MD  ?

## 2022-01-21 NOTE — Progress Notes (Signed)
Nutrition Follow-up ? ?DOCUMENTATION CODES:  ? ?Obesity unspecified ? ?INTERVENTION:  ? ?-Continue Glucerna Shake po TID, each supplement provides 220 kcal and 10 grams of protein  ?-Continue MVI with minerals daily ? ?NUTRITION DIAGNOSIS:  ? ?Increased nutrient needs related to post-op healing as evidenced by estimated needs. ? ?Ongoing ? ?GOAL:  ? ?Patient will meet greater than or equal to 90% of their needs ? ?Progressing  ? ?MONITOR:  ? ?PO intake, Supplement acceptance, Labs, Weight trends, Skin, I & O's ? ?REASON FOR ASSESSMENT:  ? ?Consult ?Assessment of nutrition requirement/status, Hip fracture protocol ? ?ASSESSMENT:  ? ?71 year old male with history of HTN, DM, and hyprlipidemia has been admitted with lt ankle fracture s/p fall. ?  ?4/9- s/p PROCEDURE:  OPEN REDUCTION INTERNAL FIXATION (ORIF) LEFT TRIMALLEOLAR ANKLE FRACTURE ? ?Reviewed I/O's: +961 ml x 24 hours and +741 ml since admission ? ?UOP: 400 ml x 24 hours ? ?Spoke with pt and wife at bedside. Pt reports feeling better today, just worked with physical therapy. Pt reports good appetite, consuming 100% of meals.  ? ?PTA pt reports good appetite, consuming 3 meals per day (Breakfast: eggs and toast; Lunch and Dinner: meat, starch, and vegetable). Pt and wife share cooking responsibilities at home.  ? ?Pt denies any weight loss.  ? ?Discussed importance of good meal and supplement intake to promote healing.  ? ?Per TOC notes, plan to d/c home tomorrow.  ? ?Medications reviewed and include lovaza, vitamin B-12, and senokot.  ? ?Labs reviewed: CBGS: 157-298 (inpatient orders for glycemic control are 0-5 units insulin aspart daily at bedtime, 35 units inuslin glargine-yfgn daily, 4 mg amaryl daily, and 1000 mg metfomrin BID).   ? ?NUTRITION - FOCUSED PHYSICAL EXAM: ? ?Flowsheet Row Most Recent Value  ?Orbital Region No depletion  ?Upper Arm Region No depletion  ?Thoracic and Lumbar Region No depletion  ?Buccal Region No depletion  ?Temple Region No  depletion  ?Clavicle Bone Region No depletion  ?Clavicle and Acromion Bone Region No depletion  ?Scapular Bone Region No depletion  ?Dorsal Hand No depletion  ?Patellar Region No depletion  ?Anterior Thigh Region No depletion  ?Posterior Calf Region No depletion  ?Edema (RD Assessment) None  ?Hair Reviewed  ?Eyes Reviewed  ?Mouth Reviewed  ?Skin Reviewed  ?Nails Reviewed  ? ?  ? ? ?Diet Order:   ?Diet Order   ? ?       ?  Diet Carb Modified Fluid consistency: Thin; Room service appropriate? Yes  Diet effective now       ?  ? ?  ?  ? ?  ? ? ?EDUCATION NEEDS:  ? ?No education needs have been identified at this time ? ?Skin:  Skin Assessment: Reviewed RN Assessment ? ?Last BM:  Unknown ? ?Height:  ? ?Ht Readings from Last 1 Encounters:  ?01/19/22 5\' 5"  (1.651 m)  ? ? ?Weight:  ? ?Wt Readings from Last 1 Encounters:  ?01/19/22 86.2 kg  ? ? ?Ideal Body Weight:  64.5 kg ? ?BMI:  Body mass index is 31.62 kg/m?. ? ?Estimated Nutritional Needs:  ? ?Kcal:  1750-1950 ? ?Protein:  85-100 grams ? ?Fluid:  > 1.7 L ? ? ? ?03/21/22, RD, LDN, CDCES ?Registered Dietitian II ?Certified Diabetes Care and Education Specialist ?Please refer to [facility] for RD and/or RD on-call/weekend/after hours pager  ?

## 2022-01-21 NOTE — Progress Notes (Signed)
Physical Therapy Treatment ?Patient Details ?Name: Craig Donaldson ?MRN: [id number] ?DOB: [birth year] ?Today's Date: 01/21/2022 ? ? ?History of Present Illness Pt is a 71 y.o. male with medical history significant of HTN, hyperlipidemia, DM, asthma, left ureteral calculus, CKD-IIIa, who presents with fall and left ankle pain. Pt diagnosed with left trimalleolar ankle fracture and is s/p L ankle ORIF. ? ?  ?PT Comments  ? ? Pt was pleasant and motivated to participate during the session and put forth good effort throughout. Pt required decreased physical assistance with bed mobility this session and was able to perform all tasks with extra time and effort only. Pt was generally steady with transfers and gait but continued to require cuing for general safety with the RW. Pt reported no adverse symptoms during the session other than L ankle pain with SpO2 and HR WNL on room air. Pt will benefit from HHPT upon discharge to safely address deficits listed in patient problem list for decreased caregiver assistance and eventual return to PLOF. ? ?   ?Recommendations for follow up therapy are one component of a multi-disciplinary discharge planning process, led by the attending physician.  Recommendations may be updated based on patient status, additional functional criteria and insurance authorization. ? ?Follow Up Recommendations ? Home health PT ?  ?  ?Assistance Recommended at Discharge Frequent or constant Supervision/Assistance  ?Patient can return home with the following A little help with walking and/or transfers;A little help with bathing/dressing/bathroom;Assistance with cooking/housework;Help with stairs or ramp for entrance;Assist for transportation ?  ?Equipment Recommendations ? Wheelchair (measurements PT)  ?  ?Recommendations for Other Services   ? ? ?  ?Precautions / Restrictions Precautions ?Precautions: Fall ?Restrictions ?Weight Bearing Restrictions: Yes ?LLE Weight Bearing: Non weight bearing  ?   ? ?Mobility ? Bed Mobility ?Overal bed mobility: Needs Assistance ?Bed Mobility: Sit to Supine, Supine to Sit ?  ?  ?Supine to sit: Supervision ?Sit to supine: Supervision ?  ?General bed mobility comments: Significant time and effort but no physical assist needed ?  ? ?Transfers ?Overall transfer level: Needs assistance ?Equipment used: Rolling walker (2 wheels) ?Transfers: Sit to/from Stand ?Sit to Stand: Min guard, From elevated surface ?  ?  ?  ?  ?  ?General transfer comment: Min verbal cues for hand placement ?  ? ?Ambulation/Gait ?Ambulation/Gait assistance: Min guard ?Gait Distance (Feet): 12 Feet ?Assistive device: Rolling walker (2 wheels) ?  ?Gait velocity: decreased ?  ?  ?General Gait Details: Hop-to pattern with good WB compliance on the LLE with mod verbal and visual cues for sequencing most notably to prevent hopping to far forward within the walker ? ? ?Stairs ?  ?  ?  ?  ?  ? ? ?Wheelchair Mobility ?  ? ?Modified Rankin (Stroke Patients Only) ?  ? ? ?  ?Balance Overall balance assessment: Needs assistance ?  ?Sitting balance-Leahy Scale: Normal ?  ?  ?Standing balance support: Bilateral upper extremity supported, During functional activity ?Standing balance-Leahy Scale: Fair ?  ?  ?  ?  ?  ?  ?  ?  ?  ?  ?  ?  ?  ? ?  ?Cognition Arousal/Alertness: Awake/alert ?Behavior During Therapy: [facility] for tasks assessed/performed ?Overall Cognitive Status: Within Functional Limits for tasks assessed ?  ?  ?  ?  ?  ?  ?  ?  ?  ?  ?  ?  ?  ?  ?  ?  ?  ?  ?  ? ?  ?  Exercises Total Joint Exercises ?Ankle Circles/Pumps: AROM, Strengthening, Right, 10 reps ?Quad Sets: Strengthening, Both, 10 reps ?Gluteal Sets: Strengthening, Both, 10 reps ?Hip ABduction/ADduction: AAROM, 10 reps, Strengthening, AROM, Both (AAROM on the LLE) ?Straight Leg Raises: AROM, AAROM, Strengthening, Both, 10 reps (AAROM on the LLE) ?Long Arc Quad: AROM, Strengthening, Both, 10 reps ?Knee Flexion: Strengthening, Both, 10 reps ?Other  Exercises ?Other Exercises: HEP education and review ?Other Exercises: Car transfer sequencing education provided ? ?  ?General Comments   ?  ?  ? ?Pertinent Vitals/Pain Pain Assessment ?Pain Assessment: 0-10 ?Pain Score: 5  ?Pain Location: L ankle ?Pain Descriptors / Indicators: Sore ?Pain Intervention(s): Repositioned, Premedicated before session, Monitored during session  ? ? ?Home Living Family/patient expects to be discharged to:: Private residence ?Living Arrangements: Spouse/significant other ?Available Help at Discharge: Family;Available 24 hours/day ?Type of Home: House ?Home Access: Ramped entrance ?  ?  ?  ?Home Layout: One level ?Home Equipment: Agricultural consultant (2 wheels);Cane - single point ?Additional Comments: Own a lift chair  ?  ?Prior Function    ?  ?  ?   ? ?PT Goals (current goals can now be found in the care plan section) Progress towards PT goals: Progressing toward goals ? ?  ?Frequency ? ? ? BID ? ? ? ?  ?PT Plan Current plan remains appropriate  ? ? ?Co-evaluation   ?  ?  ?  ?  ? ?  ?AM-PAC PT "6 Clicks" Mobility   ?Outcome Measure ? Help needed turning from your back to your side while in a flat bed without using bedrails?: A Little ?Help needed moving from lying on your back to sitting on the side of a flat bed without using bedrails?: A Little ?Help needed moving to and from a bed to a chair (including a wheelchair)?: A Little ?Help needed standing up from a chair using your arms (e.g., wheelchair or bedside chair)?: A Little ?Help needed to walk in hospital room?: A Little ?Help needed climbing 3-5 steps with a railing? : A Lot ?6 Click Score: 17 ? ?  ?End of Session Equipment Utilized During Treatment: Gait belt ?Activity Tolerance: Patient tolerated treatment well ?Patient left: in bed;with call bell/phone within reach;with bed alarm set;with family/visitor present;with SCD's reapplied;Other (comment) (pt declined up in chair) ?Nurse Communication: Mobility status;Weight bearing  status ?PT Visit Diagnosis: Unsteadiness on feet (R26.81);Other abnormalities of gait and mobility (R26.89);Muscle weakness (generalized) (M62.81);Pain ?Pain - Right/Left: Left ?Pain - part of body: Ankle and joints of foot ?  ? ? ?Time: 1610-9604 ?PT Time Calculation (min) (ACUTE ONLY): 27 min ? ?Charges:  $Gait Training: 8-22 mins ?$Therapeutic Exercise: 8-22 mins          ?          ?D. Elly Modena PT, DPT ?01/21/22, 3:41 PM ? ? ?

## 2022-01-21 NOTE — Plan of Care (Signed)

## 2022-01-21 NOTE — Evaluation (Signed)
Occupational Therapy Evaluation ?Patient Details ?Name: Craig Donaldson ?MRN: [id number] ?DOB: 1950/11/04 ?Today's Date: 01/21/2022 ? ? ?History of Present Illness Pt is a 71 y.o. male with medical history significant of HTN, hyperlipidemia, DM, asthma, left ureteral calculus, CKD-IIIa, who presents with fall and left ankle pain. Pt diagnosed with left trimalleolar ankle fracture and is s/p L ankle ORIF.  ? ?Clinical Impression ?  ?Patient presenting with decreased Ind in self care,balance, functional mobility/transfers, endurance and safety awareness. Patient reports being independent in functional mobility and self care tasks at baseline. He and wife share IADL tasks. Patient is very fatigued with functional transfers and mobility. Min A to stand with RW and min guard for balance with transfer back to bed. Patient will benefit from acute OT to increase overall independence in the areas of ADLs, functional mobility, and safety awareness in order to safely discharge home with family. ? ?Pt will need 3 in 1 BSC for home use secondary to pt being unable to ambulate further than 10' with RW secondary to decreased endurance and strength. BSC will decrease fall risk and increase pt's ability to perform self care tasks at home.  ?   ? ?Recommendations for follow up therapy are one component of a multi-disciplinary discharge planning process, led by the attending physician.  Recommendations may be updated based on patient status, additional functional criteria and insurance authorization.  ? ?Follow Up Recommendations ? Home health OT  ?  ?Assistance Recommended at Discharge Intermittent Supervision/Assistance  ?Patient can return home with the following A little help with walking and/or transfers;A little help with bathing/dressing/bathroom;Help with stairs or ramp for entrance;Assist for transportation;Assistance with cooking/housework ? ?  ?Functional Status Assessment ? Patient has had a recent decline in their  functional status and demonstrates the ability to make significant improvements in function in a reasonable and predictable amount of time.  ?Equipment Recommendations ? BSC/3in1;Wheelchair (measurements OT);Wheelchair cushion (measurements OT)  ?  ?   ?Precautions / Restrictions Precautions ?Precautions: Fall ?Restrictions ?Weight Bearing Restrictions: Yes ?LLE Weight Bearing: Non weight bearing  ? ?  ? ?Mobility Bed Mobility ?Overal bed mobility: Needs Assistance ?Bed Mobility: Sit to Supine ?  ?  ?  ?Sit to supine: Min assist ?  ?General bed mobility comments: Min A for LLE management ?  ? ?Transfers ?Overall transfer level: Needs assistance ?Equipment used: Rolling walker (2 wheels) ?Transfers: Sit to/from Stand ?Sit to Stand: Min assist ?  ?  ?  ?  ?  ?General transfer comment: min lifting assist from recliner chair and min cuing for hand placement and technique ?  ? ?  ?Balance Overall balance assessment: Needs assistance ?  ?Sitting balance-Leahy Scale: Normal ?  ?  ?Standing balance support: Bilateral upper extremity supported, During functional activity ?Standing balance-Leahy Scale: Fair ?  ?  ?  ?  ?  ?  ?  ?  ?  ?  ?  ?  ?   ? ?ADL either performed or assessed with clinical judgement  ? ?ADL Overall ADL's : Needs assistance/impaired ?  ?  ?  ?  ?  ?  ?  ?  ?  ?  ?  ?  ?Toilet Transfer: Minimal assistance;BSC/3in1;Ambulation;Rolling walker (2 wheels) ?Toilet Transfer Details (indicate cue type and reason): simulated ?  ?  ?  ?  ?Functional mobility during ADLs: Minimal assistance;Rolling walker (2 wheels) ?   ? ? ? ?Vision Patient Visual Report: No change from baseline ?   ?   ?   ?   ? ?  Pertinent Vitals/Pain Pain Assessment ?Pain Assessment: No/denies pain ?Pain Location: L ankle ?Pain Descriptors / Indicators: Sore ?Pain Intervention(s): Repositioned, Limited activity within patient's tolerance, Monitored during session  ? ? ? ?Hand Dominance Right ?  ?Extremity/Trunk Assessment Upper Extremity  Assessment ?Upper Extremity Assessment: Generalized weakness ?  ?Lower Extremity Assessment ?Lower Extremity Assessment: Generalized weakness;LLE deficits/detail ?LLE: Unable to fully assess due to immobilization;Unable to fully assess due to pain ?  ?  ?  ?Communication Communication ?Communication: No difficulties ?  ?Cognition Arousal/Alertness: Awake/alert ?Behavior During Therapy: [facility] for tasks assessed/performed ?Overall Cognitive Status: Within Functional Limits for tasks assessed ?  ?  ?  ?  ?  ?  ?  ?  ?  ?  ?  ?  ?  ?  ?  ?  ?  ?  ?  ?   ?   ?   ? ? ?Home Living Family/patient expects to be discharged to:: Private residence ?Living Arrangements: Spouse/significant other ?Available Help at Discharge: Family;Available 24 hours/day ?Type of Home: House ?Home Access: Ramped entrance ?  ?  ?Home Layout: One level ?  ?  ?Bathroom Shower/Tub: Walk-in shower ?  ?Bathroom Toilet: Handicapped height ?  ?  ?Home Equipment: Agricultural consultantolling Walker (2 wheels);Cane - single point ?  ?Additional Comments: Own a lift chair ?  ? ?  ?Prior Functioning/Environment Prior Level of Function : Independent/Modified Independent ?  ?  ?  ?  ?  ?  ?Mobility Comments: Ind amb community distances without an AD, no other fall history other than current fall that resulted from slipping on wet grass/mud while working in the yard ?ADLs Comments: Ind with ADLs ?  ? ?  ?  ?OT Problem List: Decreased strength;Decreased activity tolerance;Decreased range of motion;Impaired balance (sitting and/or standing);Decreased safety awareness;Decreased knowledge of use of DME or AE;Pain ?  ?   ?OT Treatment/Interventions: Self-care/ADL training;Therapeutic exercise;Therapeutic activities;Energy conservation;DME and/or AE instruction;Patient/family education;Balance training;Manual therapy  ?  ?OT Goals(Current goals can be found in the care plan section) Acute Rehab OT Goals ?Patient Stated Goal: to go home ?OT Goal Formulation: With patient/family ?Time For  Goal Achievement: 02/04/22 ?Potential to Achieve Goals: Fair ?ADL Goals ?Pt Will Perform Grooming: with modified independence;standing ?Pt Will Perform Lower Body Dressing: with modified independence;sit to/from stand ?Pt Will Transfer to Toilet: with modified independence;ambulating ?Pt Will Perform Toileting - Clothing Manipulation and hygiene: with modified independence;sit to/from stand  ?OT Frequency: Min 2X/week ?  ? ?   ?AM-PAC OT "6 Clicks" Daily Activity     ?Outcome Measure Help from another person eating meals?: None ?Help from another person taking care of personal grooming?: A Little ?Help from another person toileting, which includes using toliet, bedpan, or urinal?: A Little ?Help from another person bathing (including washing, rinsing, drying)?: A Little ?Help from another person to put on and taking off regular upper body clothing?: None ?Help from another person to put on and taking off regular lower body clothing?: A Little ?6 Click Score: 20 ?  ?End of Session Equipment Utilized During Treatment: Rolling walker (2 wheels) ?Nurse Communication: Mobility status ? ?Activity Tolerance: Patient tolerated treatment well;Patient limited by fatigue ?Patient left: in bed;with call bell/phone within reach;with family/visitor present ? ?OT Visit Diagnosis: Unsteadiness on feet (R26.81);Muscle weakness (generalized) (M62.81);Pain ?Pain - Right/Left: Left ?Pain - part of body: Ankle and joints of foot  ?              ?Time: 1350-1406 ?OT Time Calculation (min):  16 min ?Charges:  OT General Charges ?$OT Visit: 1 Visit ?OT Evaluation ?$OT Eval Low Complexity: 1 Low ?OT Treatments ?$Therapeutic Activity: 8-22 mins ? ?Jackquline Denmark, MS, OTR/L , CBIS ?ascom [phone]  ?01/21/22, 2:22 PM  ?

## 2022-01-21 NOTE — Care Management CC44 (Signed)
Condition Code 44 Documentation Completed ? ?Patient Details  ?Name: Craig Donaldson ?MRN: [id number] ?Date of Birth: 1950-11-30 ? ? ?Condition Code 44 given:  Yes ?Patient signature on Condition Code 44 notice:  Yes ?Documentation of 2 MD's agreement:  Yes ?Code 44 added to claim:  Yes ? ? ? ?Gustavo Dispenza A Zacharius Funari, LCSW ?01/21/2022, 1:38 PM ? ?

## 2022-01-21 NOTE — Plan of Care (Signed)
  Problem: Education: Goal: Knowledge of General Education information will improve Description: Including pain rating scale, medication(s)/side effects and non-pharmacologic comfort measures Outcome: Progressing   Problem: Health Behavior/Discharge Planning: Goal: Ability to manage health-related needs will improve Outcome: Progressing   Problem: Clinical Measurements: Goal: Ability to maintain clinical measurements within normal limits will improve Outcome: Progressing Goal: Will remain free from infection Outcome: Progressing Goal: Diagnostic test results will improve Outcome: Progressing Goal: Respiratory complications will improve Outcome: Progressing Goal: Cardiovascular complication will be avoided Outcome: Progressing   Problem: Activity: Goal: Risk for activity intolerance will decrease Outcome: Progressing   Problem: Nutrition: Goal: Adequate nutrition will be maintained Outcome: Progressing   Problem: Coping: Goal: Level of anxiety will decrease Outcome: Progressing   Problem: Elimination: Goal: Will not experience complications related to bowel motility Outcome: Progressing Goal: Will not experience complications related to urinary retention Outcome: Progressing   Problem: Pain Managment: Goal: General experience of comfort will improve Outcome: Progressing   Problem: Skin Integrity: Goal: Risk for impaired skin integrity will decrease Outcome: Progressing   Problem: Education: Goal: Ability to describe self-care measures that may prevent or decrease complications (Diabetes Survival Skills Education) will improve Outcome: Progressing Goal: Individualized Educational Video(s) Outcome: Progressing   Problem: Coping: Goal: Ability to adjust to condition or change in health will improve Outcome: Progressing   Problem: Fluid Volume: Goal: Ability to maintain a balanced intake and output will improve Outcome: Progressing   Problem: Health  Behavior/Discharge Planning: Goal: Ability to identify and utilize available resources and services will improve Outcome: Progressing Goal: Ability to manage health-related needs will improve Outcome: Progressing   Problem: Metabolic: Goal: Ability to maintain appropriate glucose levels will improve Outcome: Progressing   Problem: Nutritional: Goal: Maintenance of adequate nutrition will improve Outcome: Progressing Goal: Progress toward achieving an optimal weight will improve Outcome: Progressing   Problem: Skin Integrity: Goal: Risk for impaired skin integrity will decrease Outcome: Progressing   Problem: Tissue Perfusion: Goal: Adequacy of tissue perfusion will improve Outcome: Progressing   

## 2022-01-21 NOTE — Evaluation (Signed)
Physical Therapy Evaluation ?Patient Details ?Name: Craig Donaldson ?MRN: [id number] ?DOB: Apr 08, 1951 ?Today's Date: 01/21/2022 ? ?History of Present Illness ? Pt is a 71 y.o. male with medical history significant of HTN, hyperlipidemia, DM, asthma, left ureteral calculus, CKD-IIIa, who presents with fall and left ankle pain. Pt diagnosed with left trimalleolar ankle fracture and is s/p L ankle ORIF. ?  ?Clinical Impression ? Pt was pleasant and motivated to participate during the session and put forth good effort throughout. Pt required extra time and effort with bed mobility tasks and min A to manage the LLE during sit to sup.  Pt was generally steady with transfers and gait with cues for sequencing but fatigued quickly during hop-to gait training and was only able to make it a max of around 10 feet before needing to return to sitting with mod SOB, SpO2 and HR WNL on room air.  Pt will likely be safe with a RW at home for very short functional distances but will require a w/c for extended household mobility, safe entry/exit from the house using a ramp, and for community access.  Family looking into obtaining a w/c from friends and will follow up later this date.  Pt will benefit from HHPT upon discharge to safely address deficits listed in patient problem list for decreased caregiver assistance and eventual return to PLOF. ? ?   ?   ? ?Recommendations for follow up therapy are one component of a multi-disciplinary discharge planning process, led by the attending physician.  Recommendations may be updated based on patient status, additional functional criteria and insurance authorization. ? ?Follow Up Recommendations Home health PT ? ?  ?Assistance Recommended at Discharge Frequent or constant Supervision/Assistance  ?Patient can return home with the following ? A little help with walking and/or transfers;A little help with bathing/dressing/bathroom;Assistance with cooking/housework;Help with stairs or ramp for  entrance;Assist for transportation ? ?  ?Equipment Recommendations Other (comment) (Will need a w/c, family looking into obtaining one)  ?Recommendations for Other Services ?    ?  ?Functional Status Assessment Patient has had a recent decline in their functional status and demonstrates the ability to make significant improvements in function in a reasonable and predictable amount of time.  ? ?  ?Precautions / Restrictions Precautions ?Precautions: Fall ?Restrictions ?Weight Bearing Restrictions: Yes ?LLE Weight Bearing: Non weight bearing  ? ?  ? ?Mobility ? Bed Mobility ?Overal bed mobility: Needs Assistance ?Bed Mobility: Supine to Sit, Sit to Supine ?  ?  ?Supine to sit: Supervision ?Sit to supine: Min assist ?  ?General bed mobility comments: Min A for LLE management during sit to sup ?  ? ?Transfers ?Overall transfer level: Needs assistance ?Equipment used: Rolling walker (2 wheels) ?Transfers: Sit to/from Stand ?Sit to Stand: Min guard ?  ?  ?  ?  ?  ?General transfer comment: Mod verbal cues for hand placement ?  ? ?Ambulation/Gait ?Ambulation/Gait assistance: Min guard ?Gait Distance (Feet): 10 Feet ?Assistive device: Rolling walker (2 wheels) ?  ?Gait velocity: decreased ?  ?  ?General Gait Details: Hop-to pattern with good WB compliance on the LLE with mod verbal and visual cues for sequencing, max distance limited to around 10 feet secondary to fatigue ? ?Stairs ?  ?  ?  ?  ?  ? ?Wheelchair Mobility ?  ? ?Modified Rankin (Stroke Patients Only) ?  ? ?  ? ?Balance Overall balance assessment: Needs assistance ?  ?Sitting balance-Leahy Scale: Normal ?  ?  ?Standing balance  support: Bilateral upper extremity supported, During functional activity ?Standing balance-Leahy Scale: Fair ?  ?  ?  ?  ?  ?  ?  ?  ?  ?  ?  ?  ?   ? ? ? ?Pertinent Vitals/Pain Pain Assessment ?Pain Assessment: 0-10 ?Pain Score: 2  ?Pain Location: L ankle ?Pain Descriptors / Indicators: Sore ?Pain Intervention(s): Repositioned,  Premedicated before session, Ice applied, Monitored during session  ? ? ?Home Living Family/patient expects to be discharged to:: Private residence ?Living Arrangements: Spouse/significant other ?Available Help at Discharge: Family;Available 24 hours/day ?Type of Home: House ?Home Access: Ramped entrance ?  ?  ?  ?Home Layout: One level ?Home Equipment: Agricultural consultantolling Walker (2 wheels);Cane - single point ?Additional Comments: Own a lift chair  ?  ?Prior Function Prior Level of Function : Independent/Modified Independent ?  ?  ?  ?  ?  ?  ?Mobility Comments: Ind amb community distances without an AD, no other fall history other than current fall that resulted from slipping on wet grass/mud while working in the yard ?ADLs Comments: Ind with ADLs ?  ? ? ?Hand Dominance  ?   ? ?  ?Extremity/Trunk Assessment  ? Upper Extremity Assessment ?Upper Extremity Assessment: Overall WFL for tasks assessed ?  ? ?Lower Extremity Assessment ?Lower Extremity Assessment: Generalized weakness;LLE deficits/detail ?LLE: Unable to fully assess due to immobilization;Unable to fully assess due to pain ?  ? ?   ?Communication  ? Communication: No difficulties  ?Cognition Arousal/Alertness: Awake/alert ?Behavior During Therapy: [facility] for tasks assessed/performed ?Overall Cognitive Status: Within Functional Limits for tasks assessed ?  ?  ?  ?  ?  ?  ?  ?  ?  ?  ?  ?  ?  ?  ?  ?  ?  ?  ?  ? ?  ?General Comments   ? ?  ?Exercises Total Joint Exercises ?Ankle Circles/Pumps: AROM, Strengthening, Right, 10 reps ?Quad Sets: Strengthening, Both, 10 reps ?Gluteal Sets: Strengthening, Both, 10 reps ?Long Arc Quad: AROM, Strengthening, Both, 10 reps ?Knee Flexion: Strengthening, Both, 10 reps ?Other Exercises ?Other Exercises: Knee scooter training attempted but pt unable to achieve sufficient L knee flex ROM to safely use the scooter  ? ?Assessment/Plan  ?  ?PT Assessment Patient needs continued PT services  ?PT Problem List Decreased strength;Decreased  activity tolerance;Decreased balance;Decreased mobility;Decreased knowledge of use of DME;Pain;Decreased knowledge of precautions ? ?   ?  ?PT Treatment Interventions DME instruction;Gait training;Functional mobility training;Therapeutic activities;Therapeutic exercise;Balance training;Patient/family education   ? ?PT Goals (Current goals can be found in the Care Plan section)  ?Acute Rehab PT Goals ?Patient Stated Goal: To be able to get to the bathroom on my own ?PT Goal Formulation: With patient ?Time For Goal Achievement: 02/03/22 ?Potential to Achieve Goals: Good ? ?  ?Frequency BID ?  ? ? ?Co-evaluation   ?  ?  ?  ?  ? ? ?  ?AM-PAC PT "6 Clicks" Mobility  ?Outcome Measure Help needed turning from your back to your side while in a flat bed without using bedrails?: A Little ?Help needed moving from lying on your back to sitting on the side of a flat bed without using bedrails?: A Little ?Help needed moving to and from a bed to a chair (including a wheelchair)?: A Little ?Help needed standing up from a chair using your arms (e.g., wheelchair or bedside chair)?: A Little ?Help needed to walk in hospital room?: A Little ?Help needed climbing  3-5 steps with a railing? : A Lot ?6 Click Score: 17 ? ?  ?End of Session Equipment Utilized During Treatment: Gait belt ?Activity Tolerance: Patient tolerated treatment well ?Patient left: in bed;with call bell/phone within reach;with bed alarm set;with family/visitor present;with SCD's reapplied;Other (comment) (Pt declined up in chair) ?Nurse Communication: Mobility status;Weight bearing status ?PT Visit Diagnosis: Unsteadiness on feet (R26.81);Other abnormalities of gait and mobility (R26.89);Muscle weakness (generalized) (M62.81);Pain ?Pain - Right/Left: Left ?Pain - part of body: Ankle and joints of foot ?  ? ?Time: 9924-2683 ?PT Time Calculation (min) (ACUTE ONLY): 51 min ? ? ?Charges:   PT Evaluation ?$PT Eval Moderate Complexity: 1 Mod ?PT Treatments ?$Gait Training:  8-22 mins ?$Therapeutic Exercise: 8-22 mins ?  ?   ? ? ?D. Elly Modena PT, DPT ?01/21/22, 11:27 AM ? ? ?

## 2022-01-21 NOTE — Clinical Social Work Note (Cosign Needed)
?  ?  Durable Medical Equipment  ?(From admission, onward)  ?  ? ? ?  ? ?  Start     Ordered  ? 01/21/22 1420  For home use only DME Bedside commode  Once       ?Question:  Patient needs a bedside commode to treat with the following condition  Answer:  Ankle fracture  ? 01/21/22 1419  ? 01/21/22 1419  For home use only DME lightweight manual wheelchair with seat cushion  Once       ?Comments: Patient suffers from ankle fracture which impairs their ability to perform daily activities like bathing, dressing, feeding, grooming, and toileting in the home.  A cane, crutch, or walker will not resolve  ?issue with performing activities of daily living. A wheelchair will allow patient to safely perform daily activities. Patient is not able to propel themselves in the home using a standard weight wheelchair due to endurance and general weakness. Patient can self propel in the lightweight wheelchair. Length of need 6 months . ?Accessories: elevating leg rests (ELRs), wheel locks, extensions and anti-tippers.  ? 01/21/22 1419  ? ?  ?  ? ?  ?  ?

## 2022-01-21 NOTE — TOC Initial Note (Signed)
Transition of Care (TOC) - Initial/Assessment Note  ? ? ?Patient Details  ?Name: Craig Donaldson ?MRN: [id number] ?Date of Birth: 07-Mar-1951 ? ?Transition of Care (TOC) CM/SW Contact:    ?Farrel Guimond A Melaya [place]elton, LCSW ?Phone Number: ?01/21/2022, 2:24 PM ? ?Clinical Narrative:                 ?CSW spoke with pt at bedside regarding dc plan. Plan is for pt to dc home tomorrow. Pt lives with his spouse. Pt states he is agreeable to [facility]. Pt also states he needs a wheelchair and a bedside commode. No agency preference for [facility]. [facility] will service for [facility] PT and OT. DME wheelchair and bedside commode ordered through [employer].  ? ?Expected Discharge Plan: [place] ?Barriers to Discharge: Continued Medical Work up ? ? ?Patient Goals and CMS Choice ?Patient states their goals for this hospitalization and ongoing recovery are:: to dc home ?  ?Choice offered to / list presented to : Patient ? ?Expected Discharge Plan and Services ?Expected Discharge Plan: [place] ?In-house Referral: Clinical Social Work ?  ?Post Acute Care Choice: Home Health ?Living arrangements for the past 2 months: [place] ?                ?DME Arranged: Lightweight manual wheelchair with seat cushion, Bedside commode ?DME Agency: [facility] ?Date DME Agency Contacted: 01/21/22 ?Time DME Agency Contacted: [id number] spoke with at DME Agency: rhonda ?HH Arranged: OT, PT ?[place] Agency: [facility] ?Date HH Agency Contacted: 01/21/22 ?Time [place]: [id number] spoke with at [place]: [place] ? ?Prior Living Arrangements/Services ?Living arrangements for the past 2 months: [place] ?Lives with:: Spouse ?Patient language and need for interpreter reviewed:: Yes ?Do you feel safe going back to the place where you live?: Yes      ?Need for Family Participation in Patient Care: Yes (Comment) ?Care giver support system in place?: Yes (comment) ?  ?Criminal Activity/Legal Involvement  Pertinent to Current Situation/Hospitalization: No - Comment as needed ? ?Activities of Daily Living ?Home Assistive Devices/Equipment: Dentures (specify type), Eyeglasses ?ADL Screening (condition at time of admission) ?Patient's cognitive ability adequate to safely complete daily activities?: Yes ?Is the patient deaf or have difficulty hearing?: Yes ?Does the patient have difficulty seeing, even when wearing glasses/contacts?: No ?Does the patient have difficulty concentrating, remembering, or making decisions?: No ?Patient able to express need for assistance with ADLs?: No ?Does the patient have difficulty dressing or bathing?: No ?Independently performs ADLs?: Yes (appropriate for developmental age) ?Does the patient have difficulty walking or climbing stairs?: No ?Weakness of Legs: Both ?Weakness of Arms/Hands: None ? ?Permission Sought/Granted ?Permission sought to share information with : Family Supports ?Permission granted to share information with : Yes, Verbal Permission Granted ? Share Information with NAME: Helene Kelp ?   ? Permission granted to share info w Relationship: spouse ?   ? ?Emotional Assessment ?Appearance:: Appears stated age ?Attitude/Demeanor/Rapport: Engaged ?Affect (typically observed): Accepting ?Orientation: : Oriented to Self, Oriented to Place, Oriented to  Time, Oriented to Situation ?Alcohol / Substance Use: Not Applicable ?Psych Involvement: No (comment) ? ?Admission diagnosis:  Closed left ankle fracture [S82.892A] ?Displaced fracture of distal end of left fibula [S82.832A] ?Displaced fracture of medial malleolus of left tibia, initial encounter for closed fracture [S82.52XA] ?Ankle fracture [S82.899A] ?Patient Active Problem List  ? Diagnosis Date Noted  ? Ankle fracture 01/21/2022  ? Closed left ankle fracture 01/19/2022  ?  Hypertension 01/19/2022  ? Hyperlipidemia 01/19/2022  ? History of asthma 01/19/2022  ? Diabetes mellitus without complication ([place]) [id number]  ? Fall  01/19/2022  ? Chronic kidney disease, stage 3a ([place]) 01/19/2022  ? Leukocytosis 01/19/2022  ? ?PCP:  Dion Body, MD ?Pharmacy:   ?[facility] [place] ?[place] ?[place] [address] ?Phone: [phone] Fax: [phone] ? ? ? ? ?Social Determinants of Health (SDOH) Interventions ?  ? ?Readmission Risk Interventions ?   ? View : No data to display.  ?  ?  ?  ? ? ? ?

## 2022-01-22 ENCOUNTER — Encounter: Payer: Self-pay | Admitting: Orthopedic Surgery

## 2022-01-22 DIAGNOSIS — S82892A Other fracture of left lower leg, initial encounter for closed fracture: Secondary | ICD-10-CM | POA: Diagnosis not present

## 2022-01-22 LAB — BASIC METABOLIC PANEL
Anion gap: 8 (ref 5–15)
BUN: 33 mg/dL — ABNORMAL HIGH (ref 8–23)
CO2: 27 mmol/L (ref 22–32)
Calcium: 8.9 mg/dL (ref 8.9–10.3)
Chloride: 104 mmol/L (ref 98–111)
Creatinine, Ser: 1.62 mg/dL — ABNORMAL HIGH (ref 0.61–1.24)
GFR, Estimated: 45 mL/min — ABNORMAL LOW (ref >60)
Glucose, Bld: 230 mg/dL — ABNORMAL HIGH (ref 70–99)
Potassium: 5.1 mmol/L (ref 3.5–5.1)
Sodium: 139 mmol/L (ref 135–145)

## 2022-01-22 LAB — GLUCOSE, CAPILLARY
Glucose-Capillary: 243 mg/dL — ABNORMAL HIGH (ref 70–99)
Glucose-Capillary: 244 mg/dL — ABNORMAL HIGH (ref 70–99)

## 2022-01-22 MED ORDER — ASPIRIN EC 325 MG PO TBEC: 325.0000 mg | DELAYED_RELEASE_TABLET | Freq: Every day | ORAL | 3 refills | Status: DC

## 2022-01-22 MED ORDER — ASPIRIN EC 325 MG PO TBEC: 325.0000 mg | DELAYED_RELEASE_TABLET | Freq: Two times a day (BID) | ORAL | 3 refills | Status: DC

## 2022-01-22 MED ORDER — HYDROCODONE-ACETAMINOPHEN 5-325 MG PO TABS: 1.0000 | ORAL_TABLET | ORAL | 0 refills | Status: AC | PRN

## 2022-01-22 MED ORDER — DOCUSATE SODIUM 100 MG PO CAPS
100.0000 mg | ORAL_CAPSULE | Freq: Two times a day (BID) | ORAL | 0 refills | Status: AC
Start: 2022-01-22 — End: ?

## 2022-01-22 NOTE — Progress Notes (Signed)
Blood pressure 123/71, pulse 71, temperature 98.2 ?F (36.8 ?C), resp. rate 16, height 5\' 5"  (1.651 m), weight 86.2 kg, SpO2 96 %. ?IV removed site c/d/I pt d.c with all dme and personal belongings down to personal car.  ?

## 2022-01-22 NOTE — Progress Notes (Signed)
Physical Therapy Treatment ?Patient Details ?Name: Craig Donaldson ?MRN: [id number] ?DOB: April 25, 1951 ?Today's Date: 01/22/2022 ? ? ?History of Present Illness Pt is a 71 y.o. male with medical history significant of HTN, hyperlipidemia, DM, asthma, left ureteral calculus, CKD-IIIa, who presents with fall and left ankle pain. Pt diagnosed with left trimalleolar ankle fracture and is s/p L ankle ORIF. ? ?  ?PT Comments  ? ? Pt was pleasant and motivated to participate during the session and put forth good effort throughout. Pt continued to require significant time and effort with RLE mobility during sup to sit but did not require physical assistance.  Pt steady with amb with cues for proper sequencing.  Pt training provided on w/c mobility including navigating in tight spaces with pt demonstrating good carryover. Pt will benefit from HHPT upon discharge to safely address deficits listed in patient problem list for decreased caregiver assistance and eventual return to PLOF. ?   ?   ?Recommendations for follow up therapy are one component of a multi-disciplinary discharge planning process, led by the attending physician.  Recommendations may be updated based on patient status, additional functional criteria and insurance authorization. ? ?Follow Up Recommendations ? Home health PT ?  ?  ?Assistance Recommended at Discharge Frequent or constant Supervision/Assistance  ?Patient can return home with the following A little help with walking and/or transfers;A little help with bathing/dressing/bathroom;Assistance with cooking/housework;Help with stairs or ramp for entrance;Assist for transportation ?  ?Equipment Recommendations ? Wheelchair (measurements PT)  ?  ?Recommendations for Other Services   ? ? ?  ?Precautions / Restrictions Precautions ?Precautions: Fall ?Restrictions ?Weight Bearing Restrictions: Yes ?LLE Weight Bearing: Non weight bearing  ?  ? ?Mobility ? Bed Mobility ?Overal bed mobility: Needs Assistance ?Bed  Mobility: Supine to Sit ?  ?  ?Supine to sit: Supervision ?  ?  ?General bed mobility comments: Significant time and effort but no physical assist needed ?  ? ?Transfers ?Overall transfer level: Needs assistance ?Equipment used: Rolling walker (2 wheels) ?Transfers: Sit to/from Stand ?Sit to Stand: Min guard, From elevated surface ?  ?  ?  ?  ?  ?General transfer comment: Min verbal cues for hand placement ?  ? ?Ambulation/Gait ?Ambulation/Gait assistance: Min guard ?Gait Distance (Feet): 12 Feet ?Assistive device: Rolling walker (2 wheels) ?  ?Gait velocity: decreased ?  ?  ?General Gait Details: Hop-to pattern with good WB compliance on the LLE with mod verbal and visual cues for sequencing most notably to prevent hopping too far forward within the walker ? ? ?Stairs ?  ?  ?  ?  ?  ? ? ?Wheelchair Mobility ?  ? ?Modified Rankin (Stroke Patients Only) ?  ? ? ?  ?Balance Overall balance assessment: Needs assistance ?  ?Sitting balance-Leahy Scale: Normal ?  ?  ?Standing balance support: Bilateral upper extremity supported, During functional activity ?Standing balance-Leahy Scale: Fair ?  ?  ?  ?  ?  ?  ?  ?  ?  ?  ?  ?  ?  ? ?  ?Cognition Arousal/Alertness: Awake/alert ?Behavior During Therapy: [facility] for tasks assessed/performed ?Overall Cognitive Status: Within Functional Limits for tasks assessed ?  ?  ?  ?  ?  ?  ?  ?  ?  ?  ?  ?  ?  ?  ?  ?  ?  ?  ?  ? ?  ?Exercises Total Joint Exercises ?Ankle Circles/Pumps: AROM, Strengthening, Right, 10 reps ?Quad Sets: Strengthening, Both,  10 reps ?Long [employer]: Strengthening, Both, 10 reps ?Knee Flexion: Strengthening, Both, 10 reps ?Other Exercises ?Other Exercises: w/c training with pt and spouse ? ?  ?General Comments   ?  ?  ? ?Pertinent Vitals/Pain Pain Assessment ?Pain Assessment: 0-10 ?Pain Score: 3  ?Pain Location: L ankle ?Pain Descriptors / Indicators: Sore ?Pain Intervention(s): Repositioned, Premedicated before session, Monitored during session  ? ? ?Home Living    ?  ?  ?  ?  ?  ?  ?  ?  ?  ?   ?  ?Prior Function    ?  ?  ?   ? ?PT Goals (current goals can now be found in the care plan section) Progress towards PT goals: Progressing toward goals ? ?  ?Frequency ? ? ? BID ? ? ? ?  ?PT Plan Current plan remains appropriate  ? ? ?Co-evaluation   ?  ?  ?  ?  ? ?  ?AM-PAC PT "6 Clicks" Mobility   ?Outcome Measure ? Help needed turning from your back to your side while in a flat bed without using bedrails?: A Little ?Help needed moving from lying on your back to sitting on the side of a flat bed without using bedrails?: A Little ?Help needed moving to and from a bed to a chair (including a wheelchair)?: A Little ?Help needed standing up from a chair using your arms (e.g., wheelchair or bedside chair)?: A Little ?Help needed to walk in hospital room?: A Little ?Help needed climbing 3-5 steps with a railing? : A Lot ?6 Click Score: 17 ? ?  ?End of Session Equipment Utilized During Treatment: Gait belt ?Activity Tolerance: Patient tolerated treatment well ?Patient left: with call bell/phone within reach;with family/visitor present;in chair;with chair alarm set ?Nurse Communication: Mobility status;Weight bearing status ?PT Visit Diagnosis: Unsteadiness on feet (R26.81);Other abnormalities of gait and mobility (R26.89);Muscle weakness (generalized) (M62.81);Pain ?Pain - Right/Left: Left ?Pain - part of body: Ankle and joints of foot ?  ? ? ?Time: [id number] ?PT Time Calculation (min) (ACUTE ONLY): 28 min ? ?Charges:  $Gait Training: 8-22 mins ?$Therapeutic Activity: 8-22 mins          ?          ?D. Royetta Asal PT, DPT ?01/22/22, 12:47 PM ? ? ?

## 2022-01-22 NOTE — Progress Notes (Addendum)
?  Subjective: ? ?POD #2 s/p left trimalleolar ankle ORIF.   Patient reports left ankle pain as mild.  Patient is up out of bed to a chair.  He feels improved compared to yesterday.  Patient's wife is at the bedside. ? ?Objective:  ? ?VITALS:   ?Vitals:  ? 01/21/22 2035 01/22/22 0340 01/22/22 0815 01/22/22 1215  ?BP: 125/63 113/71 113/66 123/71  ?Pulse: 70 72 77 71  ?Resp: 18 16 16 16   ?Temp: 98.5 ?F (36.9 ?C) 98.8 ?F (37.1 ?C) 98.6 ?F (37 ?C) 98.2 ?F (36.8 ?C)  ?TempSrc:      ?SpO2: 93% 94% 94% 96%  ?Weight:      ?Height:      ? ? ?PHYSICAL EXAM: ?Left lower extremity: ?Patient splint is in place.  His dressings are clean dry and intact.  Patient's toes are well-perfused.  He can flex and extend his toes and has intact sensation light touch in all 5 toes in the first dorsal webspace. ? ? ?LABS ? ?Results for orders placed or performed during the hospital encounter of 01/19/22 (from the past 24 hour(s))  ?Glucose, capillary     Status: Abnormal  ? Collection Time: 01/21/22  4:54 PM  ?Result Value Ref Range  ? Glucose-Capillary 281 (H) 70 - 99 mg/dL  ?Glucose, capillary     Status: Abnormal  ? Collection Time: 01/21/22  9:20 PM  ?Result Value Ref Range  ? Glucose-Capillary 304 (H) 70 - 99 mg/dL  ?Basic metabolic panel     Status: Abnormal  ? Collection Time: 01/22/22  3:55 AM  ?Result Value Ref Range  ? Sodium 139 135 - 145 mmol/L  ? Potassium 5.1 3.5 - 5.1 mmol/L  ? Chloride 104 98 - 111 mmol/L  ? CO2 27 22 - 32 mmol/L  ? Glucose, Bld 230 (H) 70 - 99 mg/dL  ? BUN 33 (H) 8 - 23 mg/dL  ? Creatinine, Ser 1.62 (H) 0.61 - 1.24 mg/dL  ? Calcium 8.9 8.9 - 10.3 mg/dL  ? GFR, Estimated 45 (L) >60 mL/min  ? Anion gap 8 5 - 15  ?Glucose, capillary     Status: Abnormal  ? Collection Time: 01/22/22  8:12 AM  ?Result Value Ref Range  ? Glucose-Capillary 243 (H) 70 - 99 mg/dL  ?Glucose, capillary     Status: Abnormal  ? Collection Time: 01/22/22 12:10 PM  ?Result Value Ref Range  ? Glucose-Capillary 244 (H) 70 - 99 mg/dL  ? ? ?No  results found. ? ?Assessment/Plan: ?2 Days Post-Op  ? ?Principal Problem: ?  Closed left ankle fracture ?Active Problems: ?  Hypertension ?  Hyperlipidemia ?  History of asthma ?  Diabetes mellitus without complication ([place]) ?  Fall ?  Chronic kidney disease, stage 3a ([place]) ?  Leukocytosis ?  Ankle fracture ? ?Patient doing well postop.  Patient will continue his nonweightbearing restriction for a total of 6 weeks postop.  He was encouraged to continue to elevate his left ankle while at home.  Patient will follow-up in the office in 10 to 14 days.  He will take aspirin for DVT prophylaxis.  Patient and his wife understood and agreed with this plan.  He will be discharged home today. ? ? ? ?Thornton Park , MD ?01/22/2022, 2:25 PM ? ? ? ? ?  ?

## 2022-01-22 NOTE — Discharge Summary (Signed)
?Physician Discharge Summary ?  ?Patient: Craig Donaldson MRN: [id number] DOB: 17-Feb-1951  ?Admit date:     01/19/2022  ?Discharge date: 01/22/2022  ?Discharge Physician: Dwyane Dee  ? ?PCP: Dion Body, MD  ? ?Recommendations at discharge:  ? ?Follow-up outpatient with orthopedic surgery ? ?Discharge Diagnoses: ?Principal Problem: ?  Closed left ankle fracture ?Active Problems: ?  Hypertension ?  Hyperlipidemia ?  History of asthma ?  Diabetes mellitus without complication ([place]) ?  Fall ?  Chronic kidney disease, stage 3a ([place]) ?  Leukocytosis ?  Ankle fracture ? ?Resolved Problems: ?  * No resolved hospital problems. * ? ?Hospital Course: ? ?Craig Donaldson is a 71 y.o. male with medical history significant of hypertension, hyperlipidemia, diabetes mellitus, asthma, left ureteral calculus, CKD-IIIa, who presented with fall and left ankle pain. ?Imaging studies on admission showed displaced fractures of the medial malleolus and distal fibula.  He was evaluated by orthopedic surgery and underwent ORIF on 01/20/2022.  He will continue with restrictions of nonweightbearing to the left lower extremity for 6-8 weeks at discharge.  He was continued on aspirin for DVT prophylaxis and will follow-up outpatient with orthopedic surgery.  ? ?  ? ? ?Consultants: Orthopedic surgery ?Procedures performed: ORIF left trimalleolar ankle fracture, 01/20/2022 ?Disposition: Home ?Diet recommendation:  ?Discharge Diet Orders (From admission, onward)  ? ?  Start     Ordered  ? 01/22/22 0000  Diet Carb Modified       ? 01/22/22 1106  ? ?  ?  ? ?  ? ?Carb modified diet ?DISCHARGE MEDICATION: ?Allergies as of 01/22/2022   ? ?   Reactions  ? Amoxicillin Rash  ? Other Rash  ? ?  ? ?  ?Medication List  ?  ? ?STOP taking these medications   ? ?aspirin 81 MG tablet ?Replaced by: aspirin EC 325 MG tablet ?  ? ?  ? ?TAKE these medications   ? ?albuterol 108 (90 Base) MCG/ACT inhaler ?Commonly known as: VENTOLIN HFA ?Inhale 2 puffs into the  lungs in the morning, at noon, in the evening, and at bedtime. ?  ?aspirin EC 325 MG tablet ?Take 1 tablet (325 mg total) by mouth 2 (two) times daily. Take until ortho followup; once done, resume aspirin 81 mg daily ?Replaces: aspirin 81 MG tablet ?  ?atorvastatin 20 MG tablet ?Commonly known as: LIPITOR ?Take 20 mg by mouth every morning. ?  ?benazepril-hydrochlorthiazide 10-12.5 MG tablet ?Commonly known as: LOTENSIN HCT ?Take 1 tablet by mouth every morning. ?  ?Cholecalciferol 25 MCG (1000 UT) capsule ?Take 1,000 Units by mouth daily. ?  ?cyanocobalamin 1000 MCG tablet ?Take 1,000 mcg by mouth daily. ?  ?docusate sodium 100 MG capsule ?Commonly known as: COLACE ?Take 1 capsule (100 mg total) by mouth 2 (two) times daily. ?  ?fenofibrate 160 MG tablet ?Take 160 mg by mouth daily. ?  ?Fish Oil 1000 MG Caps ?Take 1 capsule by mouth daily. ?  ?glimepiride 4 MG tablet ?Commonly known as: AMARYL ?Take 4 mg by mouth 2 (two) times daily. ?  ?HYDROcodone-acetaminophen 5-325 MG tablet ?Commonly known as: NORCO/VICODIN ?Take 1 tablet by mouth every 4 (four) hours as needed for up to 7 days for moderate pain or severe pain. ?  ?linagliptin 5 MG Tabs tablet ?Commonly known as: TRADJENTA ?Take 5 mg by mouth daily. ?  ?metFORMIN 1000 MG tablet ?Commonly known as: GLUCOPHAGE ?Take 1,000 mg by mouth 2 (two) times daily with a meal. ?  ?metoprolol  succinate 50 MG 24 hr tablet ?Commonly known as: TOPROL-XL ?Take 50 mg by mouth every morning. Take with or immediately following a meal. ?  ?Toujeo Max SoloStar 300 UNIT/ML Solostar Pen ?Generic drug: insulin glargine (2 Unit Dial) ?Inject 66 Units into the skin daily. ?  ? ?  ? ?  ?  ? ? ?  ?Durable Medical Equipment  ?(From admission, onward)  ?  ? ? ?  ? ?  Start     Ordered  ? 01/21/22 1537  For home use only DME standard manual wheelchair with seat cushion  Once       ?Comments: Patient suffers from L ankle fracture with surgical repair which impairs their ability to perform  daily activities like toileting in the home.  A walker will not resolve issue with performing activities of daily living. A wheelchair will allow patient to safely perform daily activities. Patient can safely propel the wheelchair in the home or has a caregiver who can provide assistance. Length of need 2 months. ?Accessories: elevating leg rests (ELRs), wheel locks, extensions and anti-tippers.  ? 01/21/22 1538  ? 01/21/22 1420  For home use only DME Bedside commode  Once       ?Question:  Patient needs a bedside commode to treat with the following condition  Answer:  Ankle fracture  ? 01/21/22 1419  ? ?  ?  ? ?  ? ? ?  ?Discharge Care Instructions  ?(From admission, onward)  ?  ? ? ?  ? ?  Start     Ordered  ? 01/22/22 0000  Discharge wound care:       ?Comments: Keep dressing clean, dry, intact until follow up  ? 01/22/22 1106  ? ?  ?  ? ?  ? ? ?Discharge Exam: ?Danley Danker Weights  ? 01/19/22 1236  ?Weight: 86.2 kg  ? ?Physical Exam ?Constitutional:   ?   General: He is not in acute distress. ?   Appearance: Normal appearance.  ?HENT:  ?   Head: Normocephalic and atraumatic.  ?   Mouth/Throat:  ?   Mouth: Mucous membranes are moist.  ?Eyes:  ?   Extraocular Movements: Extraocular movements intact.  ?Cardiovascular:  ?   Rate and Rhythm: Normal rate and regular rhythm.  ?   Heart sounds: Normal heart sounds.  ?Pulmonary:  ?   Effort: Pulmonary effort is normal. No respiratory distress.  ?   Breath sounds: Normal breath sounds. No wheezing.  ?Abdominal:  ?   General: Bowel sounds are normal. There is no distension.  ?   Palpations: Abdomen is soft.  ?   Tenderness: There is no abdominal tenderness.  ?Musculoskeletal:  ?   Cervical back: Normal range of motion and neck supple.  ?   Comments: Left leg brace in place  ?Skin: ?   General: Skin is warm and dry.  ?Neurological:  ?   General: No focal deficit present.  ?   Mental Status: He is alert.  ?Psychiatric:     ?   Mood and Affect: Mood normal.     ?   Behavior:  Behavior normal.  ? ? ? ?Condition at discharge: stable ? ?The results of significant diagnostics from this hospitalization (including imaging, microbiology, ancillary and laboratory) are listed below for reference.  ? ?Imaging Studies: ?DG Ankle Complete Left ? ?Result Date: 01/20/2022 ?CLINICAL DATA:  Left ankle fracture.  Status post ORIF. EXAM: LEFT ANKLE COMPLETE - 3+ VIEW COMPARISON:  01/19/2022 FINDINGS:  Three view study. Fine detail obscured by overlying fiberglass splint. Patient is status post lateral plate and screw fixation of the distal fibula fracture with screw fixation of the medial malleolar fracture fragment. Bony alignment improved compared to the pre reduction films. No evidence for immediate hardware complication. IMPRESSION: Status post ORIF for distal fibula and medial malleolar fractures. No evidence for immediate hardware complications. Electronically Signed   By: Misty Stanley M.D.   On: 01/20/2022 13:46  ? ?DG Ankle Complete Left ? ?Result Date: 01/19/2022 ?CLINICAL DATA:  Fall, injury to ankle. EXAM: LEFT ANKLE COMPLETE - 3+ VIEW COMPARISON:  None. FINDINGS: Displaced fractures of the medial malleolus and distal fibula. Ankle mortise remains grossly symmetric. No fracture is seen within the underlying talus or hindfoot. Soft tissue swelling overlying the medial malleolus. IMPRESSION: Displaced fractures of the medial malleolus and distal fibula. Associated soft tissue swelling. Electronically Signed   By: Franki Cabot M.D.   On: 01/19/2022 13:04  ? ?DG Chest Port 1 View ? ?Result Date: 01/19/2022 ?CLINICAL DATA:  Preoperative respiratory evaluation for left ankle surgery. EXAM: PORTABLE CHEST 1 VIEW COMPARISON:  10/29/2007 FINDINGS: 1425 hours. The lungs are clear without focal pneumonia, edema, pneumothorax or pleural effusion. The cardiopericardial silhouette is within normal limits for size. The visualized bony structures of the thorax are unremarkable. IMPRESSION: No active disease.  Electronically Signed   By: Misty Stanley M.D.   On: 01/19/2022 14:41  ? ?[place] OR NERVE BLOCK-IMAGE ONLY [facility]) ? ?Result Date: 01/20/2022 ?There is no interpretation for this exam.  This order is for images obtained during

## 2022-01-22 NOTE — Plan of Care (Signed)
  Problem: Education: Goal: Knowledge of General Education information will improve Description: Including pain rating scale, medication(s)/side effects and non-pharmacologic comfort measures Outcome: Progressing   Problem: Health Behavior/Discharge Planning: Goal: Ability to manage health-related needs will improve Outcome: Progressing   Problem: Clinical Measurements: Goal: Ability to maintain clinical measurements within normal limits will improve Outcome: Progressing Goal: Will remain free from infection Outcome: Progressing Goal: Diagnostic test results will improve Outcome: Progressing Goal: Respiratory complications will improve Outcome: Progressing Goal: Cardiovascular complication will be avoided Outcome: Progressing   Problem: Activity: Goal: Risk for activity intolerance will decrease Outcome: Progressing   Problem: Nutrition: Goal: Adequate nutrition will be maintained Outcome: Progressing   Problem: Coping: Goal: Level of anxiety will decrease Outcome: Progressing   Problem: Elimination: Goal: Will not experience complications related to bowel motility Outcome: Progressing Goal: Will not experience complications related to urinary retention Outcome: Progressing   Problem: Pain Managment: Goal: General experience of comfort will improve Outcome: Progressing   Problem: Safety: Goal: Ability to remain free from injury will improve Outcome: Progressing   Problem: Skin Integrity: Goal: Risk for impaired skin integrity will decrease Outcome: Progressing   Problem: Education: Goal: Ability to describe self-care measures that may prevent or decrease complications (Diabetes Survival Skills Education) will improve Outcome: Progressing Goal: Individualized Educational Video(s) Outcome: Progressing   Problem: Coping: Goal: Ability to adjust to condition or change in health will improve Outcome: Progressing   Problem: Fluid Volume: Goal: Ability to  maintain a balanced intake and output will improve Outcome: Progressing   Problem: Health Behavior/Discharge Planning: Goal: Ability to identify and utilize available resources and services will improve Outcome: Progressing Goal: Ability to manage health-related needs will improve Outcome: Progressing   Problem: Metabolic: Goal: Ability to maintain appropriate glucose levels will improve Outcome: Progressing   Problem: Nutritional: Goal: Maintenance of adequate nutrition will improve Outcome: Progressing Goal: Progress toward achieving an optimal weight will improve Outcome: Progressing   Problem: Skin Integrity: Goal: Risk for impaired skin integrity will decrease Outcome: Progressing   

## 2022-02-15 ENCOUNTER — Other Ambulatory Visit: Payer: Self-pay

## 2022-02-15 ENCOUNTER — Emergency Department: Payer: Medicare PPO

## 2022-02-15 ENCOUNTER — Inpatient Hospital Stay
Admission: EM | Admit: 2022-02-15 | Discharge: 2022-02-19 | DRG: 164 | Disposition: A | Discharge status: Home / Self Care | Payer: Medicare PPO | Attending: Internal Medicine | Admitting: Internal Medicine

## 2022-02-15 DIAGNOSIS — I248 Other forms of acute ischemic heart disease: Secondary | ICD-10-CM | POA: Diagnosis present

## 2022-02-15 DIAGNOSIS — R079 Chest pain, unspecified: Secondary | ICD-10-CM

## 2022-02-15 DIAGNOSIS — I959 Hypotension, unspecified: Secondary | ICD-10-CM | POA: Diagnosis not present

## 2022-02-15 DIAGNOSIS — Z7984 Long term (current) use of oral hypoglycemic drugs: Secondary | ICD-10-CM

## 2022-02-15 DIAGNOSIS — Z9181 History of falling: Secondary | ICD-10-CM

## 2022-02-15 DIAGNOSIS — E782 Mixed hyperlipidemia: Secondary | ICD-10-CM | POA: Diagnosis present

## 2022-02-15 DIAGNOSIS — Z20822 Contact with and (suspected) exposure to covid-19: Secondary | ICD-10-CM | POA: Diagnosis present

## 2022-02-15 DIAGNOSIS — F17290 Nicotine dependence, other tobacco product, uncomplicated: Secondary | ICD-10-CM | POA: Diagnosis present

## 2022-02-15 DIAGNOSIS — E7800 Pure hypercholesterolemia, unspecified: Secondary | ICD-10-CM | POA: Diagnosis present

## 2022-02-15 DIAGNOSIS — D72829 Elevated white blood cell count, unspecified: Secondary | ICD-10-CM | POA: Diagnosis present

## 2022-02-15 DIAGNOSIS — I7121 Aneurysm of the ascending aorta, without rupture: Secondary | ICD-10-CM | POA: Diagnosis present

## 2022-02-15 DIAGNOSIS — Z833 Family history of diabetes mellitus: Secondary | ICD-10-CM

## 2022-02-15 DIAGNOSIS — I2609 Other pulmonary embolism with acute cor pulmonale: Secondary | ICD-10-CM | POA: Diagnosis not present

## 2022-02-15 DIAGNOSIS — Z888 Allergy status to other drugs, medicaments and biological substances status: Secondary | ICD-10-CM

## 2022-02-15 DIAGNOSIS — I878 Other specified disorders of veins: Secondary | ICD-10-CM | POA: Diagnosis present

## 2022-02-15 DIAGNOSIS — I2602 Saddle embolus of pulmonary artery with acute cor pulmonale: Principal | ICD-10-CM | POA: Diagnosis present

## 2022-02-15 DIAGNOSIS — R0789 Other chest pain: Secondary | ICD-10-CM | POA: Diagnosis not present

## 2022-02-15 DIAGNOSIS — J45909 Unspecified asthma, uncomplicated: Secondary | ICD-10-CM | POA: Diagnosis present

## 2022-02-15 DIAGNOSIS — R000 Tachycardia, unspecified: Secondary | ICD-10-CM | POA: Diagnosis not present

## 2022-02-15 DIAGNOSIS — N183 Chronic kidney disease, stage 3 unspecified: Secondary | ICD-10-CM | POA: Diagnosis present

## 2022-02-15 DIAGNOSIS — Z87442 Personal history of urinary calculi: Secondary | ICD-10-CM

## 2022-02-15 DIAGNOSIS — N1831 Chronic kidney disease, stage 3a: Secondary | ICD-10-CM | POA: Diagnosis present

## 2022-02-15 DIAGNOSIS — I10 Essential (primary) hypertension: Secondary | ICD-10-CM | POA: Diagnosis present

## 2022-02-15 DIAGNOSIS — E785 Hyperlipidemia, unspecified: Secondary | ICD-10-CM | POA: Diagnosis present

## 2022-02-15 DIAGNOSIS — I2699 Other pulmonary embolism without acute cor pulmonale: Secondary | ICD-10-CM | POA: Diagnosis present

## 2022-02-15 DIAGNOSIS — Z880 Allergy status to penicillin: Secondary | ICD-10-CM

## 2022-02-15 DIAGNOSIS — E119 Type 2 diabetes mellitus without complications: Secondary | ICD-10-CM

## 2022-02-15 DIAGNOSIS — Z79899 Other long term (current) drug therapy: Secondary | ICD-10-CM

## 2022-02-15 DIAGNOSIS — N390 Urinary tract infection, site not specified: Secondary | ICD-10-CM | POA: Diagnosis present

## 2022-02-15 DIAGNOSIS — S82892A Other fracture of left lower leg, initial encounter for closed fracture: Secondary | ICD-10-CM | POA: Diagnosis present

## 2022-02-15 DIAGNOSIS — B9620 Unspecified Escherichia coli [E. coli] as the cause of diseases classified elsewhere: Secondary | ICD-10-CM | POA: Diagnosis present

## 2022-02-15 DIAGNOSIS — Z7982 Long term (current) use of aspirin: Secondary | ICD-10-CM

## 2022-02-15 DIAGNOSIS — I129 Hypertensive chronic kidney disease with stage 1 through stage 4 chronic kidney disease, or unspecified chronic kidney disease: Secondary | ICD-10-CM | POA: Diagnosis present

## 2022-02-15 DIAGNOSIS — E1169 Type 2 diabetes mellitus with other specified complication: Secondary | ICD-10-CM

## 2022-02-15 DIAGNOSIS — E1122 Type 2 diabetes mellitus with diabetic chronic kidney disease: Secondary | ICD-10-CM | POA: Diagnosis present

## 2022-02-15 DIAGNOSIS — R778 Other specified abnormalities of plasma proteins: Secondary | ICD-10-CM | POA: Diagnosis present

## 2022-02-15 DIAGNOSIS — Z794 Long term (current) use of insulin: Secondary | ICD-10-CM

## 2022-02-15 LAB — COMPREHENSIVE METABOLIC PANEL
ALT: 16 U/L (ref 0–44)
AST: 17 U/L (ref 15–41)
Albumin: 3 g/dL — ABNORMAL LOW (ref 3.5–5.0)
Alkaline Phosphatase: 29 U/L — ABNORMAL LOW (ref 38–126)
Anion gap: 7 (ref 5–15)
BUN: 33 mg/dL — ABNORMAL HIGH (ref 8–23)
CO2: 24 mmol/L (ref 22–32)
Calcium: 8.6 mg/dL — ABNORMAL LOW (ref 8.9–10.3)
Chloride: 106 mmol/L (ref 98–111)
Creatinine, Ser: 1.64 mg/dL — ABNORMAL HIGH (ref 0.61–1.24)
GFR, Estimated: 44 mL/min — ABNORMAL LOW (ref >60)
Glucose, Bld: 179 mg/dL — ABNORMAL HIGH (ref 70–99)
Potassium: 4.1 mmol/L (ref 3.5–5.1)
Sodium: 137 mmol/L (ref 135–145)
Total Bilirubin: 0.8 mg/dL (ref 0.3–1.2)
Total Protein: 6.7 g/dL (ref 6.5–8.1)

## 2022-02-15 LAB — CBC
HCT: 37.3 % — ABNORMAL LOW (ref 39.0–52.0)
Hemoglobin: 11.6 g/dL — ABNORMAL LOW (ref 13.0–17.0)
MCH: 29.4 pg (ref 26.0–34.0)
MCHC: 31.1 g/dL (ref 30.0–36.0)
MCV: 94.7 fL (ref 80.0–100.0)
Platelets: 326 10*3/uL (ref 150–400)
RBC: 3.94 MIL/uL — ABNORMAL LOW (ref 4.22–5.81)
RDW: 15.2 % (ref 11.5–15.5)
WBC: 13.1 10*3/uL — ABNORMAL HIGH (ref 4.0–10.5)
nRBC: 0 % (ref 0.0–0.2)

## 2022-02-15 LAB — BRAIN NATRIURETIC PEPTIDE: B Natriuretic Peptide: 258.8 pg/mL — ABNORMAL HIGH (ref 0.0–100.0)

## 2022-02-15 LAB — TROPONIN I (HIGH SENSITIVITY): Troponin I (High Sensitivity): 223 ng/L (ref <18)

## 2022-02-15 MED ORDER — IOHEXOL 350 MG/ML SOLN
80.0000 mL | Freq: Once | INTRAVENOUS | Status: AC | PRN
  Administered 2022-02-15: 80 mL via INTRAVENOUS

## 2022-02-15 MED ORDER — HEPARIN BOLUS VIA INFUSION
5000.0000 [IU] | Freq: Once | INTRAVENOUS | Status: AC
  Administered 2022-02-16: 5000 [IU] via INTRAVENOUS
  Filled 2022-02-15: qty 5000

## 2022-02-15 MED ORDER — HEPARIN (PORCINE) 25000 UT/250ML-% IV SOLN
2000.0000 [IU]/h | INTRAVENOUS | Status: DC
  Administered 2022-02-15 – 2022-02-16 (×2): 1300 [IU]/h via INTRAVENOUS
  Administered 2022-02-17 – 2022-02-19 (×5): 1700 [IU]/h via INTRAVENOUS
  Filled 2022-02-15 (×6): qty 250

## 2022-02-15 MED ORDER — SODIUM CHLORIDE 0.9 % IV BOLUS
1000.0000 mL | Freq: Once | INTRAVENOUS | Status: AC
  Administered 2022-02-15: 1000 mL via INTRAVENOUS

## 2022-02-15 NOTE — ED Provider Notes (Addendum)
? ?[facility] ?Provider Note ? ? ? Event Date/Time  ? First MD Initiated Contact with Patient 02/15/22 2229   ?  (approximate) ? ?History  ? ?Chief Complaint: Chest Pain ? ?HPI ? ?Craig Donaldson is a 71 y.o. male with a past medical history of CKD, hypertension, hyperlipidemia, diabetes, presents to the emergency department for chest pain shortness of breath.  According to the patient around 8 PM tonight he had sudden onset of central chest pain as well as shortness of breath and diaphoresis.  Patient states the chest pain and shortness of breath have alleviated somewhat but still he states a 2/10 of chest discomfort and mild shortness of breath worse if he tries to take a deep breath.  Patient states approximately 3 weeks ago he had a left ankle surgery patient is currently in a short leg cast in the left lower extremity.  Patient denies any history of cardiac disease.  Denies any history of DVT or PE previously.  Patient has been taking a 325 mg aspirin for the past 2 weeks and then switch to 81 mg aspirin 1 week ago. ? ?Physical Exam  ? ?Triage Vital Signs: ?ED Triage Vitals  ?Enc Vitals Group  ?   BP 02/15/22 2227 (!) 89/65  ?   Pulse Rate 02/15/22 2227 100  ?   Resp 02/15/22 2227 19  ?   Temp 02/15/22 2227 (!) 97.5 ?F (36.4 ?C)  ?   Temp Source 02/15/22 2227 Oral  ?   SpO2 02/15/22 2227 94 %  ?   Weight --   ?   Height --   ?   Head Circumference --   ?   Peak Flow --   ?   Pain Score 02/15/22 2228 0  ?   Pain Loc --   ?   Pain Edu? --   ?   Excl. in GC? --   ? ? ?Most recent vital signs: ?Vitals:  ? 02/15/22 2227  ?BP: (!) 89/65  ?Pulse: 100  ?Resp: 19  ?Temp: (!) 97.5 ?F (36.4 ?C)  ?SpO2: 94%  ? ? ?General: Awake, no distress.  ?CV:  Good peripheral perfusion.  Regular rate and rhythm  ?Resp:  Normal effort.  Equal breath sounds bilaterally.  ?Abd:  No distention.  Soft, nontender.  No rebound or guarding. ?Other:  Patient has a short leg cast in place in the left lower  extremity. ? ? ?ED Results / Procedures / Treatments  ? ?EKG ? ?EKG viewed and interpreted by myself shows sinus tachycardia 103 bpm with a narrow QRS, normal axis, slight QTc prolongation otherwise normal intervals, no concerning ST changes. ? ?RADIOLOGY ? ?I personally reviewed the CT images patient appears to have saddle emboli as well as large bilateral main pulmonary artery emboli. ? ? ?MEDICATIONS ORDERED IN ED: ?Medications  ?sodium chloride 0.9 % bolus 1,000 mL (has no administration in time range)  ? ? ? ?IMPRESSION / MDM / ASSESSMENT AND PLAN / ED COURSE  ?I reviewed the triage vital signs and the nursing notes. ? ?Patient presents to the emergency department for sudden onset of chest pain shortness of breath and diaphoresis at 8 PM tonight.  Patient has a short leg cast in place in left lower extremity.  Differential would include pulmonary embolism, ACS, pneumonia, pneumothorax.  Patient's vitals show tachycardia around 100 to 110 bpm.  Normal respiratory rate with a pulse ox around 94% on room air currently hypotensive 89/65 we will  begin IV hydration with 1 L of normal saline.  We will check labs and obtain CT imaging of the chest to further evaluate.  Patient agreeable to plan of care. ? ?CBC shows mild leukocytosis 13,000, mild anemia not significantly changed from 1 month ago. ? ?Chemistry shows creatinine 1.64, GFR 44.  Patient's troponin has resulted elevated 223.  Highly suspect pulmonary embolism we will start the patient on heparin infusion.  GFR can tolerate CTA scan patient receiving IV fluids as well.  We will proceed with CTA imaging.  Patient care signed out to oncoming provider.  Patient will require admission once his emergency department work-up is been completed. ? ?CTA has resulted showing large bilateral emboli as well as saddle emboli.  I have ordered heparin for the patient.  We will discuss with vascular surgery for further evaluation. ? ?CRITICAL CARE ?Performed by: Minna Antis ? ? ?Total critical care time: 30 minutes ? ?Critical care time was exclusive of separately billable procedures and treating other patients. ? ?Critical care was necessary to treat or prevent imminent or life-threatening deterioration. ? ?Critical care was time spent personally by me on the following activities: development of treatment plan with patient and/or surrogate as well as nursing, discussions with consultants, evaluation of patient's response to treatment, examination of patient, obtaining history from patient or surrogate, ordering and performing treatments and interventions, ordering and review of laboratory studies, ordering and review of radiographic studies, pulse oximetry and re-evaluation of patient's condition. ? ? ?FINAL CLINICAL IMPRESSION(S) / ED DIAGNOSES  ? ?Chest pain ?Dyspnea ?Elevated troponin ? ?Note:  This document was prepared using Dragon voice recognition software and may include unintentional dictation errors. ?  ?Minna Antis, MD ?02/15/22 2317 ? ?  ?Minna Antis, MD ?02/18/22 1420 ? ?

## 2022-02-15 NOTE — ED Triage Notes (Signed)
Pt presents to ER via ems from home c/o left sternal area, non radiating chest pain that started around 2000 tonight.  On arrival ems states pt was pale and diaphoretic, and slightly hypotensive.  Pt had left ankle surgery done here on 4/9 and did not have any complications.  Pt denies taking blood thinners at home.  Pt endorses some increased weakness for last 3-4 days.  Pt is currently A&O x4 and in NAD at this time.   ?

## 2022-02-15 NOTE — Progress Notes (Addendum)
ANTICOAGULATION CONSULT NOTE ? ?Pharmacy Consult for heparin infusion ?Indication: pulmonary embolus ? ?Allergies  ?Allergen Reactions  ? Amoxicillin Rash  ? Other Rash  ? ? ?Patient Measurements: ?  ?Heparin Dosing Weight: 83.1 kg ? ?Vital Signs: ?Temp: 97.5 ?F (36.4 ?C) (05/05 2227) ?Temp Source: Oral (05/05 2227) ?BP: 89/72 (05/05 2300) ?Pulse Rate: 96 (05/05 2300) ? ?Labs: ?Recent Labs  ?  02/15/22 ?2222  ?HGB 11.6*  ?HCT 37.3*  ?PLT 326  ?CREATININE 1.64*  ?TROPONINIHS 223*  ? ? ?CrCl cannot be calculated (Unknown ideal weight.). ? ? ?Medical History: ?Past Medical History:  ?Diagnosis Date  ? Chronic kidney disease, stage 3a ([place])   ? History of asthma   ? NO ISSUES SINCE THE 1990'S  ? Hyperlipidemia   ? Hypertension   ? Left ureteral calculus   ? Type 2 diabetes mellitus ([place])   ? ? ?Assessment: ?Pt is 71 yo male presenting to ED c/o sudden onset of central chest pain, SOB, and diaphoresis. ? ?Goal of Therapy:  ?Heparin level 0.3-0.7 units/ml ?Monitor platelets by anticoagulation protocol: Yes ?  ?Plan:  ?Bolus 5000 units x 1 ?Start heparin infusion at 1050 units/hr ?Check HL in 8 hr after start of infusion ?CBC daily while on heparin ? ?Renda Rolls, PharmD, MBA ?02/15/2022 ?11:48 PM ? ? ? ?

## 2022-02-15 NOTE — ED Provider Notes (Incomplete Revision)
? ?[facility] ?Provider Note ? ? ? Event Date/Time  ? First MD Initiated Contact with Patient 02/15/22 2229   ?  (approximate) ? ?History  ? ?Chief Complaint: Chest Pain ? ?HPI ? ?Craig Donaldson is a 71 y.o. male with a past medical history of CKD, hypertension, hyperlipidemia, diabetes, presents to the emergency department for chest pain shortness of breath.  According to the patient around 8 PM tonight he had sudden onset of central chest pain as well as shortness of breath and diaphoresis.  Patient states the chest pain and shortness of breath have alleviated somewhat but still he states a 2/10 of chest discomfort and mild shortness of breath worse if he tries to take a deep breath.  Patient states approximately 3 weeks ago he had a left ankle surgery patient is currently in a short leg cast in the left lower extremity.  Patient denies any history of cardiac disease.  Denies any history of DVT or PE previously.  Patient has been taking a 325 mg aspirin for the past 2 weeks and then switch to 81 mg aspirin 1 week ago. ? ?Physical Exam  ? ?Triage Vital Signs: ?ED Triage Vitals  ?Enc Vitals Group  ?   BP 02/15/22 2227 (!) 89/65  ?   Pulse Rate 02/15/22 2227 100  ?   Resp 02/15/22 2227 19  ?   Temp 02/15/22 2227 (!) 97.5 ?F (36.4 ?C)  ?   Temp Source 02/15/22 2227 Oral  ?   SpO2 02/15/22 2227 94 %  ?   Weight --   ?   Height --   ?   Head Circumference --   ?   Peak Flow --   ?   Pain Score 02/15/22 2228 0  ?   Pain Loc --   ?   Pain Edu? --   ?   Excl. in GC? --   ? ? ?Most recent vital signs: ?Vitals:  ? 02/15/22 2227  ?BP: (!) 89/65  ?Pulse: 100  ?Resp: 19  ?Temp: (!) 97.5 ?F (36.4 ?C)  ?SpO2: 94%  ? ? ?General: Awake, no distress.  ?CV:  Good peripheral perfusion.  Regular rate and rhythm  ?Resp:  Normal effort.  Equal breath sounds bilaterally.  ?Abd:  No distention.  Soft, nontender.  No rebound or guarding. ?Other:  Patient has a short leg cast in place in the left lower  extremity. ? ? ?ED Results / Procedures / Treatments  ? ?EKG ? ?EKG viewed and interpreted by myself shows sinus tachycardia 103 bpm with a narrow QRS, normal axis, slight QTc prolongation otherwise normal intervals, no concerning ST changes. ? ?RADIOLOGY ? ?I personally reviewed the CT images patient appears to have saddle emboli as well as large bilateral main pulmonary artery emboli. ? ? ?MEDICATIONS ORDERED IN ED: ?Medications  ?sodium chloride 0.9 % bolus 1,000 mL (has no administration in time range)  ? ? ? ?IMPRESSION / MDM / ASSESSMENT AND PLAN / ED COURSE  ?I reviewed the triage vital signs and the nursing notes. ? ?Patient presents to the emergency department for sudden onset of chest pain shortness of breath and diaphoresis at 8 PM tonight.  Patient has a short leg cast in place in left lower extremity.  Differential would include pulmonary embolism, ACS, pneumonia, pneumothorax.  Patient's vitals show tachycardia around 100 to 110 bpm.  Normal respiratory rate with a pulse ox around 94% on room air currently hypotensive 89/65 we will  begin IV hydration with 1 L of normal saline.  We will check labs and obtain CT imaging of the chest to further evaluate.  Patient agreeable to plan of care. ? ?CBC shows mild leukocytosis 13,000, mild anemia not significantly changed from 1 month ago. ? ?Chemistry shows creatinine 1.64, GFR 44.  Patient's troponin has resulted elevated 223.  Highly suspect pulmonary embolism we will start the patient on heparin infusion.  GFR can tolerate CTA scan patient receiving IV fluids as well.  We will proceed with CTA imaging.  Patient care signed out to oncoming provider.  Patient will require admission once his emergency department work-up is been completed. ? ?CTA has resulted showing large bilateral emboli as well as saddle emboli.  I have ordered heparin for the patient.  We will discuss with vascular surgery for further evaluation. ? ?CRITICAL CARE ?Performed by: Minna Antis ? ? ?Total critical care time: 30 minutes ? ?Critical care time was exclusive of separately billable procedures and treating other patients. ? ?Critical care was necessary to treat or prevent imminent or life-threatening deterioration. ? ?Critical care was time spent personally by me on the following activities: development of treatment plan with patient and/or surrogate as well as nursing, discussions with consultants, evaluation of patient's response to treatment, examination of patient, obtaining history from patient or surrogate, ordering and performing treatments and interventions, ordering and review of laboratory studies, ordering and review of radiographic studies, pulse oximetry and re-evaluation of patient's condition. ? ? ?FINAL CLINICAL IMPRESSION(S) / ED DIAGNOSES  ? ?Chest pain ?Dyspnea ?Elevated troponin ? ?Note:  This document was prepared using Dragon voice recognition software and may include unintentional dictation errors. ?  ?Minna Antis, MD ?02/15/22 2317 ? ?

## 2022-02-15 NOTE — ED Notes (Signed)
[facility] called for consult and possible transfer ?

## 2022-02-16 ENCOUNTER — Inpatient Hospital Stay: Payer: Medicare PPO

## 2022-02-16 DIAGNOSIS — N1831 Chronic kidney disease, stage 3a: Secondary | ICD-10-CM | POA: Diagnosis present

## 2022-02-16 DIAGNOSIS — Z20822 Contact with and (suspected) exposure to covid-19: Secondary | ICD-10-CM | POA: Diagnosis present

## 2022-02-16 DIAGNOSIS — Z888 Allergy status to other drugs, medicaments and biological substances status: Secondary | ICD-10-CM | POA: Diagnosis not present

## 2022-02-16 DIAGNOSIS — R000 Tachycardia, unspecified: Secondary | ICD-10-CM | POA: Diagnosis not present

## 2022-02-16 DIAGNOSIS — Z9181 History of falling: Secondary | ICD-10-CM | POA: Diagnosis not present

## 2022-02-16 DIAGNOSIS — J45909 Unspecified asthma, uncomplicated: Secondary | ICD-10-CM | POA: Diagnosis present

## 2022-02-16 DIAGNOSIS — I2609 Other pulmonary embolism with acute cor pulmonale: Secondary | ICD-10-CM | POA: Diagnosis not present

## 2022-02-16 DIAGNOSIS — E7800 Pure hypercholesterolemia, unspecified: Secondary | ICD-10-CM | POA: Diagnosis present

## 2022-02-16 DIAGNOSIS — Z7984 Long term (current) use of oral hypoglycemic drugs: Secondary | ICD-10-CM | POA: Diagnosis not present

## 2022-02-16 DIAGNOSIS — N390 Urinary tract infection, site not specified: Secondary | ICD-10-CM | POA: Diagnosis present

## 2022-02-16 DIAGNOSIS — Z7982 Long term (current) use of aspirin: Secondary | ICD-10-CM | POA: Diagnosis not present

## 2022-02-16 DIAGNOSIS — I2602 Saddle embolus of pulmonary artery with acute cor pulmonale: Secondary | ICD-10-CM | POA: Diagnosis present

## 2022-02-16 DIAGNOSIS — E1122 Type 2 diabetes mellitus with diabetic chronic kidney disease: Secondary | ICD-10-CM | POA: Diagnosis present

## 2022-02-16 DIAGNOSIS — R0789 Other chest pain: Secondary | ICD-10-CM | POA: Diagnosis present

## 2022-02-16 DIAGNOSIS — I129 Hypertensive chronic kidney disease with stage 1 through stage 4 chronic kidney disease, or unspecified chronic kidney disease: Secondary | ICD-10-CM | POA: Diagnosis present

## 2022-02-16 DIAGNOSIS — I7121 Aneurysm of the ascending aorta, without rupture: Secondary | ICD-10-CM | POA: Diagnosis present

## 2022-02-16 DIAGNOSIS — I248 Other forms of acute ischemic heart disease: Secondary | ICD-10-CM | POA: Diagnosis present

## 2022-02-16 DIAGNOSIS — I2699 Other pulmonary embolism without acute cor pulmonale: Secondary | ICD-10-CM | POA: Diagnosis not present

## 2022-02-16 DIAGNOSIS — Z833 Family history of diabetes mellitus: Secondary | ICD-10-CM | POA: Diagnosis not present

## 2022-02-16 DIAGNOSIS — Z880 Allergy status to penicillin: Secondary | ICD-10-CM | POA: Diagnosis not present

## 2022-02-16 DIAGNOSIS — Z794 Long term (current) use of insulin: Secondary | ICD-10-CM | POA: Diagnosis not present

## 2022-02-16 DIAGNOSIS — I878 Other specified disorders of veins: Secondary | ICD-10-CM | POA: Diagnosis present

## 2022-02-16 DIAGNOSIS — B9620 Unspecified Escherichia coli [E. coli] as the cause of diseases classified elsewhere: Secondary | ICD-10-CM | POA: Diagnosis present

## 2022-02-16 DIAGNOSIS — Z87442 Personal history of urinary calculi: Secondary | ICD-10-CM | POA: Diagnosis not present

## 2022-02-16 DIAGNOSIS — Z79899 Other long term (current) drug therapy: Secondary | ICD-10-CM | POA: Diagnosis not present

## 2022-02-16 DIAGNOSIS — I959 Hypotension, unspecified: Secondary | ICD-10-CM | POA: Diagnosis not present

## 2022-02-16 DIAGNOSIS — F17290 Nicotine dependence, other tobacco product, uncomplicated: Secondary | ICD-10-CM | POA: Diagnosis present

## 2022-02-16 LAB — HEPARIN LEVEL (UNFRACTIONATED)
Heparin Unfractionated: 0.13 IU/mL — ABNORMAL LOW (ref 0.30–0.70)
Heparin Unfractionated: 0.17 IU/mL — ABNORMAL LOW (ref 0.30–0.70)
Heparin Unfractionated: 0.22 IU/mL — ABNORMAL LOW (ref 0.30–0.70)

## 2022-02-16 LAB — CBC
HCT: 35.5 % — ABNORMAL LOW (ref 39.0–52.0)
Hemoglobin: 11.3 g/dL — ABNORMAL LOW (ref 13.0–17.0)
MCH: 29.4 pg (ref 26.0–34.0)
MCHC: 31.8 g/dL (ref 30.0–36.0)
MCV: 92.2 fL (ref 80.0–100.0)
Platelets: 308 10*3/uL (ref 150–400)
RBC: 3.85 MIL/uL — ABNORMAL LOW (ref 4.22–5.81)
RDW: 15.2 % (ref 11.5–15.5)
WBC: 12.3 10*3/uL — ABNORMAL HIGH (ref 4.0–10.5)
nRBC: 0 % (ref 0.0–0.2)

## 2022-02-16 LAB — URINALYSIS, COMPLETE (UACMP) WITH MICROSCOPIC
Bilirubin Urine: NEGATIVE
Glucose, UA: NEGATIVE mg/dL
Ketones, ur: NEGATIVE mg/dL
Nitrite: NEGATIVE
Protein, ur: 30 mg/dL — AB
RBC / HPF: >50 RBC/hpf — ABNORMAL HIGH (ref 0–5)
Specific Gravity, Urine: 1.019 (ref 1.005–1.030)
WBC, UA: >50 WBC/hpf — ABNORMAL HIGH (ref 0–5)
pH: 5 (ref 5.0–8.0)

## 2022-02-16 LAB — BASIC METABOLIC PANEL
Anion gap: 6 (ref 5–15)
BUN: 31 mg/dL — ABNORMAL HIGH (ref 8–23)
CO2: 22 mmol/L (ref 22–32)
Calcium: 8.3 mg/dL — ABNORMAL LOW (ref 8.9–10.3)
Chloride: 109 mmol/L (ref 98–111)
Creatinine, Ser: 1.5 mg/dL — ABNORMAL HIGH (ref 0.61–1.24)
GFR, Estimated: 49 mL/min — ABNORMAL LOW (ref >60)
Glucose, Bld: 120 mg/dL — ABNORMAL HIGH (ref 70–99)
Potassium: 3.9 mmol/L (ref 3.5–5.1)
Sodium: 137 mmol/L (ref 135–145)

## 2022-02-16 LAB — RESP PANEL BY RT-PCR (FLU A&B, COVID) ARPGX2
Influenza A by PCR: NEGATIVE
Influenza B by PCR: NEGATIVE
SARS Coronavirus 2 by RT PCR: NEGATIVE

## 2022-02-16 LAB — PROTIME-INR
INR: 1.3 — ABNORMAL HIGH (ref 0.8–1.2)
Prothrombin Time: 16 seconds — ABNORMAL HIGH (ref 11.4–15.2)

## 2022-02-16 LAB — PHOSPHORUS: Phosphorus: 3.6 mg/dL (ref 2.5–4.6)

## 2022-02-16 LAB — APTT
aPTT: >200 seconds (ref 24–36)
aPTT: 56 seconds — ABNORMAL HIGH (ref 24–36)

## 2022-02-16 LAB — CBG MONITORING, ED
Glucose-Capillary: 126 mg/dL — ABNORMAL HIGH (ref 70–99)
Glucose-Capillary: 111 mg/dL — ABNORMAL HIGH (ref 70–99)
Glucose-Capillary: 148 mg/dL — ABNORMAL HIGH (ref 70–99)
Glucose-Capillary: 157 mg/dL — ABNORMAL HIGH (ref 70–99)
Glucose-Capillary: 123 mg/dL — ABNORMAL HIGH (ref 70–99)

## 2022-02-16 LAB — MAGNESIUM: Magnesium: 2 mg/dL (ref 1.7–2.4)

## 2022-02-16 LAB — GLUCOSE, CAPILLARY: Glucose-Capillary: 110 mg/dL — ABNORMAL HIGH (ref 70–99)

## 2022-02-16 LAB — TROPONIN I (HIGH SENSITIVITY): Troponin I (High Sensitivity): 316 ng/L (ref <18)

## 2022-02-16 MED ORDER — HEPARIN BOLUS VIA INFUSION
2500.0000 [IU] | Freq: Once | INTRAVENOUS | Status: AC
  Administered 2022-02-16: 2500 [IU] via INTRAVENOUS
  Filled 2022-02-16: qty 2500

## 2022-02-16 MED ORDER — INSULIN ASPART 100 UNIT/ML IJ SOLN
0.0000 [IU] | INTRAMUSCULAR | Status: DC
  Administered 2022-02-16: 2 [IU] via SUBCUTANEOUS
  Administered 2022-02-16 – 2022-02-17 (×3): 1 [IU] via SUBCUTANEOUS
  Filled 2022-02-16 (×4): qty 1

## 2022-02-16 MED ORDER — POLYETHYLENE GLYCOL 3350 17 G PO PACK: 17.0000 g | PACK | Freq: Every day | ORAL | Status: DC | PRN

## 2022-02-16 MED ORDER — IPRATROPIUM-ALBUTEROL 0.5-2.5 (3) MG/3ML IN SOLN
3.0000 mL | Freq: Four times a day (QID) | RESPIRATORY_TRACT | Status: DC | PRN
Start: 2022-02-16 — End: 2022-02-19

## 2022-02-16 MED ORDER — BUDESONIDE 0.25 MG/2ML IN SUSP
0.2500 mg | Freq: Two times a day (BID) | RESPIRATORY_TRACT | Status: DC
  Administered 2022-02-16 – 2022-02-19 (×7): 0.25 mg via RESPIRATORY_TRACT
  Filled 2022-02-16 (×7): qty 2

## 2022-02-16 MED ORDER — DOCUSATE SODIUM 100 MG PO CAPS: 100.0000 mg | ORAL_CAPSULE | Freq: Two times a day (BID) | ORAL | Status: DC | PRN

## 2022-02-16 MED ORDER — LACTATED RINGERS BOLUS PEDS
1000.0000 mL | Freq: Once | INTRAVENOUS | Status: AC
  Administered 2022-02-16: 1000 mL via INTRAVENOUS

## 2022-02-16 MED ORDER — MIDODRINE HCL 5 MG PO TABS
10.0000 mg | ORAL_TABLET | Freq: Three times a day (TID) | ORAL | Status: DC
Start: 2022-02-16 — End: 2022-02-19
  Administered 2022-02-16 – 2022-02-19 (×7): 10 mg via ORAL
  Filled 2022-02-16 (×7): qty 2

## 2022-02-16 NOTE — ED Provider Notes (Signed)
----------------------------------------- ?  12:22 AM on 02/16/2022 ?-----------------------------------------  ? ?Spoke with [facility] hospitalist Dr. Martyn Malay who is willing to accept the patient but directed me first to PCCM intensivist to discuss criteria of case/whether patient requires urgent intervention.  PCCM intensivist suggest I speak with our intensivist to evaluate patient for intervention versus admitting the patient to our facility. ? ?----------------------------------------- ?12:59 AM on 02/16/2022 ?-----------------------------------------  ? ?Patient will be admitted to the CCU at our facility.  Remains hemodynamically stable at this time. ?  ?Irean Hong, MD ?02/16/22 [phone] ? ?

## 2022-02-16 NOTE — Progress Notes (Signed)
ANTICOAGULATION CONSULT NOTE ? ?Pharmacy Consult for heparin infusion ?Indication: pulmonary embolus ? ?Allergies  ?Allergen Reactions  ? Amoxicillin Rash  ? Other Rash  ? ? ?Patient Measurements: ?Weight: 97.5 kg (215 lb) ?Heparin Dosing Weight: 83.1 kg ? ?Vital Signs: ?Temp: 97.5 ?F (36.4 ?C) (05/05 2227) ?Temp Source: Oral (05/05 2227) ?BP: 100/69 (05/06 0518) ?Pulse Rate: 89 (05/06 0518) ? ?Labs: ?Recent Labs  ?  02/15/22 ?0002 02/15/22 ?2222 02/16/22 ?0002 02/16/22 ?[id number]  ?HGB  --  11.6*  --  11.3*  ?HCT  --  37.3*  --  35.5*  ?PLT  --  326  --  308  ?APTT >200*  --   --  56*  ?LABPROT 16.0*  --   --   --   ?INR 1.3*  --   --   --   ?HEPARINUNFRC  --   --   --  0.13*  ?CREATININE  --  1.64*  --  1.50*  ?TROPONINIHS  --  223* 316*  --   ? ? ? ?Estimated Creatinine Clearance: 48.5 mL/min (A) (by C-G formula based on SCr of 1.5 mg/dL (H)). ? ? ?Medical History: ?Past Medical History:  ?Diagnosis Date  ? Chronic kidney disease, stage 3a ([place])   ? History of asthma   ? NO ISSUES SINCE THE 1990'S  ? Hyperlipidemia   ? Hypertension   ? Left ureteral calculus   ? Type 2 diabetes mellitus ([place])   ? ? ?Assessment: ?Pt is 71 yo male presenting to ED c/o sudden onset of central chest pain, SOB, and diaphoresis. ? ?Goal of Therapy:  ?Heparin level 0.3-0.7 units/ml ?Monitor platelets by anticoagulation protocol: Yes ? ?5/06 0504 HL 0.13 ?  ?Plan:  ?Bolus 2500 units x 1 ?Increase heparin infusion to 1300 units/hr ?Recheck HL in 8 hr after rate change ?CBC daily while on heparin ? ?Renda Rolls, PharmD, MBA ?02/16/2022 ?5:51 AM ? ? ? ?

## 2022-02-16 NOTE — H&P (View-Only) (Signed)
? ?[facility] ? ?Patient name: Craig Donaldson MRN: [id number] DOB: 07/31/1951 Sex: male ? ? ?REQUESTING PROVIDER:  ? ? ER ? ? ?REASON FOR CONSULT:  ?  ?Pulmonary embolism ? ?HISTORY OF PRESENT ILLNESS:  ? ?Craig Donaldson is a 71 y.o. male, who presented to the emergency department with shortness of breath.  He is status post ORIF of a left ankle fracture he sustained from a fall.  He has been immobilized in his left leg since that time.  He has been having progressive shortness of breath but got worse and so he came to the hospital.  Upon arrival he was hypotensive and hypoxic.  His heart rate was 100.  CT scan showed significant PE with right heart strain.  He was not requiring oxygen as his hypoxia improved as did his blood pressure with IV fluid resuscitation.  He is being admitted to the ICU. ? ?The patient suffers from chronic stage III renal insufficiency.  He is medically managed for hypertension.  He takes a statin for hypercholesterolemia.  He is a type II diabetic. ? ?PAST MEDICAL HISTORY  ? ? ?Past Medical History:  ?Diagnosis Date  ? Chronic kidney disease, stage 3a ([place])   ? History of asthma   ? NO ISSUES SINCE THE 1990'S  ? Hyperlipidemia   ? Hypertension   ? Left ureteral calculus   ? Type 2 diabetes mellitus ([place])   ? ? ? ?FAMILY HISTORY  ? ?Family History  ?Problem Relation Age of Onset  ? Lung cancer Father   ? Diabetes Brother   ? ? ?SOCIAL HISTORY:  ? ?Social History  ? ?Socioeconomic History  ? Marital status: Married  ?  Spouse name: Not on file  ? Number of children: Not on file  ? Years of education: Not on file  ? Highest education level: Not on file  ?Occupational History  ? Not on file  ?Tobacco Use  ? Smoking status: Never  ? Smokeless tobacco: Current  ?  Types: Snuff  ? Tobacco comments:  ?  35 YR TOBACCO USE  ?Substance and Sexual Activity  ? Alcohol use: No  ? Drug use: No  ? Sexual activity: Not on file  ?Other  Topics Concern  ? Not on file  ?Social History Narrative  ? Not on file  ? ?Social Determinants of Health  ? ?Financial Resource Strain: Not on file  ?Food Insecurity: Not on file  ?Transportation Needs: Not on file  ?Physical Activity: Not on file  ?Stress: Not on file  ?Social Connections: Not on file  ?Intimate Partner Violence: Not on file  ? ? ?ALLERGIES:  ? ? ?Allergies  ?Allergen Reactions  ? Amoxicillin Rash  ? Other Rash  ? ? ?CURRENT MEDICATIONS:  ? ? ?Current Facility-Administered Medications  ?Medication Dose Route Frequency Provider Last Rate Last Admin  ? budesonide (PULMICORT) nebulizer solution 0.25 mg  0.25 mg Nebulization BID Lang Snow, NP   0.25 mg at 02/16/22 [id number]  ? docusate sodium (COLACE) capsule 100 mg  100 mg Oral BID PRN Lang Snow, NP      ? heparin ADULT infusion 100 units/mL (25000 units/248mL)  1,300 Units/hr Intravenous Continuous Renda Rolls, RPH 13 mL/hr at 02/16/22 1230 1,300 Units/hr at 02/16/22 1230  ? insulin aspart (novoLOG) injection 0-9 Units  0-9 Units Subcutaneous Q4H Lang Snow, NP   1 Units at 02/16/22 1235  ? ipratropium-albuterol (DUONEB) 0.5-2.5 (3) MG/3ML nebulizer solution  3 mL  3 mL Nebulization Q6H PRN Lang Snow, NP      ? midodrine (PROAMATINE) tablet 10 mg  10 mg Oral TID WC Flora Lipps, MD   10 mg at 02/16/22 1239  ? polyethylene glycol (MIRALAX / GLYCOLAX) packet 17 g  17 g Oral Daily PRN Lang Snow, NP      ? ?Current Outpatient Medications  ?Medication Sig Dispense Refill  ? albuterol (VENTOLIN HFA) 108 (90 Base) MCG/ACT inhaler Inhale 2 puffs into the lungs in the morning, at noon, in the evening, and at bedtime.    ? aspirin EC 81 MG tablet Take 81 mg by mouth daily. Swallow whole.    ? atorvastatin (LIPITOR) 20 MG tablet Take 20 mg by mouth every morning.    ? benazepril-hydrochlorthiazide (LOTENSIN HCT) 10-12.5 MG per tablet Take 1 tablet by mouth every morning.    ? Cholecalciferol 25  MCG (1000 UT) capsule Take 1,000 Units by mouth daily.    ? cyanocobalamin 1000 MCG tablet Take 1,000 mcg by mouth daily.    ? fenofibrate 160 MG tablet Take 160 mg by mouth daily.    ? glimepiride (AMARYL) 4 MG tablet Take 4 mg by mouth 2 (two) times daily.    ? magnesium oxide (MAG-OX) 400 MG tablet Take 400 mg by mouth daily.    ? metFORMIN (GLUCOPHAGE) 1000 MG tablet Take 1,000 mg by mouth 2 (two) times daily with a meal.    ? metoprolol succinate (TOPROL-XL) 50 MG 24 hr tablet Take 25 mg by mouth every morning. Take with or immediately following a meal.    ? Omega-3 Fatty Acids (FISH OIL) 1000 MG CAPS Take 1 capsule by mouth daily.    ? pioglitazone (ACTOS) 15 MG tablet Take 15 mg by mouth daily.    ? Potassium Citrate,Elemental K, 99 MG CAPS Take 1 capsule by mouth daily.    ? senna (SENOKOT) 8.6 MG tablet Take 1 tablet by mouth daily.    ? TOUJEO MAX SOLOSTAR 300 UNIT/ML Solostar Pen Inject 66 Units into the skin daily.    ? docusate sodium (COLACE) 100 MG capsule Take 1 capsule (100 mg total) by mouth 2 (two) times daily. (Patient not taking: Reported on 02/16/2022) 10 capsule 0  ? linagliptin (TRADJENTA) 5 MG TABS tablet Take 5 mg by mouth daily. (Patient not taking: Reported on 02/16/2022)    ? ? ?REVIEW OF SYSTEMS:  ? ?[X]  denotes positive finding, [ ]  denotes negative finding ?Cardiac  Comments:  ?Chest pain or chest pressure:    ?Shortness of breath upon exertion: x   ?Short of breath when lying flat: x   ?Irregular heart rhythm:    ?    ?Vascular    ?Pain in calf, thigh, or hip brought on by ambulation:    ?Pain in feet at night that wakes you up from your sleep:     ?Blood clot in your veins:    ?Leg swelling:     ?    ?Pulmonary    ?Oxygen at home:    ?Productive cough:     ?Wheezing:     ?    ?Neurologic    ?Sudden weakness in arms or legs:     ?Sudden numbness in arms or legs:     ?Sudden onset of difficulty speaking or slurred speech:    ?Temporary loss of vision in one eye:     ?Problems with  dizziness:     ?    ?  Gastrointestinal    ?Blood in stool:     ? ?Vomited blood:     ?    ?Genitourinary    ?Burning when urinating:     ?Blood in urine:    ?    ?Psychiatric    ?Major depression:     ?    ?Hematologic    ?Bleeding problems:    ?Problems with blood clotting too easily:    ?    ?Skin    ?Rashes or ulcers:    ?    ?Constitutional    ?Fever or chills:    ? ?PHYSICAL EXAM:  ? ?Vitals:  ? 02/16/22 1100 02/16/22 1130 02/16/22 1200 02/16/22 1230  ?BP: 94/77 98/73 92/74  103/72  ?Pulse: 83 83 80 80  ?Resp: 20 (!) 23 18 19   ?Temp:      ?TempSrc:      ?SpO2: 92% 93% 93% 93%  ?Weight:      ? ? ?GENERAL: The patient is a well-nourished male, in no acute distress. The vital signs are documented above. ?CARDIAC: There is a regular rate and rhythm.  ?PULMONARY: Nonlabored respirations ?ABDOMEN: Soft and non-tender  ?MUSCULOSKELETAL: There are no major deformities or cyanosis. ?NEUROLOGIC: No focal weakness or paresthesias are detected. ?SKIN: There are no ulcers or rashes noted. ?PSYCHIATRIC: The patient has a normal affect. ? ?STUDIES:  ? ?I have reviewed the following: ? ?Venous doppler: ?Suspicion of clot within the right peroneal vein. The other calf ?veins and right lower extremity proximal veins appear patent. ?  ?Left leg negative through the popliteal vein. Calf veins cannot be ?evaluated because of the presence of a cast. ? ?CTA chest: ?1. Positive for acute bilateral PE with CT evidence of right heart ?strain (RV/LV Ratio = 1.9) consistent with at least submassive ?(intermediate risk) PE. The presence of right heart strain has been ?associated with an increased risk of morbidity and mortality. Please ?refer to the "PE Focused" order set in EPIC. ?2. Clear lung fields ?3. Ascending aortic aneurysm measuring up to 4.2 cm.  ?  ?  ? ?ASSESSMENT and PLAN  ? ?PE: The patient is being admitted to the ICU for IV heparin.  Currently he is breathing comfortably on room air with oxygen saturations greater than 95%.   His heart rate is less than 100 and his blood pressure has improved.  Given the large volume thrombus on his CT scan, I discussed proceeding with pulmonary thrombectomy on Monday to expedite his recovery.  He is

## 2022-02-16 NOTE — H&P (Addendum)
? ? ?NAME:  Craig Donaldson, MRN:  [id number], DOB:  09-Oct-1951, LOS: 0 ?ADMISSION DATE:  02/15/2022, CONSULTATION DATE:  02/16/2022 ?REFERRING MD:  Lurline Hare, MD  CHIEF COMPLAINT:  SOB  ? ?HPI  ?71 y.o male with significant PMH of CKD stage III, hypertension, hyperlipidemia, asthma, type 2 diabetes mellitus, and recent left ankle fracture s/p ORIF who presented to the ED with chief complaints of non radiating chest pain. Patient describes a non radiating chest pain predominantly on the left sternal area that started yesterday at around 8 pm. Denies associated symptoms of nausea or vomiting.  ? ?Of note, patient was recently admitted on 01/19/22 with fall and left ankle pain. Imaging studies on admission showed displaced fractures of the medial malleolus and distal fibula.  He was evaluated by orthopedic surgery and underwent ORIF on 01/20/2022 and discharged on 01/22/22. Since discharge, patient's family state his mobility has been limited and spent most of his time in a wheelchair. ? ?ED Course: In the emergency department, the temperature was 36.4 ?C, the heart rate 100 beats/minute, the blood pressure  89/65 mm Hg, the respiratory rate 19 breaths/minute, and the oxygen saturation 83% on.  He was pale appearing and diaphoretic. ?Pertinent Labs in Red/Diagnostics Findings: ?Na+/ K+:137/4.1  ?Glucose: 179 ?BUN/Cr.:33/1.64 ?  ?WBC/ TMAX: 13.1/ afebrile ?Hgb/Hct: 11.6/37.3 ?COVID PCR: Negative ?  ?Troponin: [id number] ?BNP: 258.8 ?CTA Chest showed acute bilateral PE with CT evidence of right heart strain and ascending aortic aneurysm measuring up to 4.2 cm. ? ?Patient was started on Heparin gtt and PCCM consulted for admission and management.  ? ?Past Medical History  ?CKD stage III, hypertension, hyperlipidemia, asthma, type 2 diabetes mellitus, and recent left ankle fracture s/p ORIF ? ?Significant Hospital Events   ?5/6: Admitted to the ICU with acute bilateral pulmonary embolism ? ?Consults:  ?Vascular/IR ? ?Procedures:   ?None ? ?Significant Diagnostic Tests:  ?5/5: CTA Chest>. Positive for acute bilateral PE with CT evidence of right heart ?strain (RV/LV Ratio = 1.9) consistent with at least submassive ?(intermediate risk) PE. The presence of right heart strain has been ?associated with an increased risk of morbidity and mortality. Please ?refer to the "PE Focused" order set in EPIC. ?2. Clear lung fields ?3. Ascending aortic aneurysm measuring up to 4.2 cm ? ?Micro Data:  ?5/5: SARS-CoV-2 PCR> negative ?5/5: Influenza PCR> negative ? ?Antimicrobials:  ?None ? ?OBJECTIVE  ?Blood pressure (!) 88/66, pulse 94, temperature (!) 97.5 ?F (36.4 ?C), temperature source Oral, resp. rate 17, weight 97.5 kg, SpO2 95 %. ?   ?   ? ?Intake/Output Summary (Last 24 hours) at 02/16/2022 0107 ?Last data filed at 02/16/2022 0000 ?Gross per 24 hour  ?Intake 1500 ml  ?Output --  ?Net 1500 ml  ? ?Filed Weights  ? 02/15/22 2338  ?Weight: 97.5 kg  ? ?Physical Examination  ?GENERAL: 71 year-old critically ill patient lying in the bed with no acute distress.  ?EYES: Pupils equal, round, reactive to light and accommodation. No scleral icterus. Extraocular muscles intact.  ?HEENT: Head atraumatic, normocephalic. Oropharynx and nasopharynx clear.  ?NECK:  Supple, no jugular venous distention. No thyroid enlargement, no tenderness.  ?LUNGS: Normal breath sounds bilaterally, no wheezing, rales,rhonchi or crepitation. No use of accessory muscles of respiration.  ?CARDIOVASCULAR: S1, S2 normal. No murmurs, rubs, or gallops.  ?ABDOMEN: Soft, nontender, nondistended. Bowel sounds present. No organomegaly or mass.  ?EXTREMITIES:Left leg brace in place, bilateral dependent edema. ?NEUROLOGIC: Cranial nerves II through XII are intact.  unable  to assess muscle strength. Sensation intact. Gait not checked.  ?PSYCHIATRIC: The patient is alert and oriented x 3.  ?SKIN: No obvious rash, lesion, or ulcer.  ? ?Labs/imaging that I havepersonally reviewed  ?(right click and  "Reselect all SmartList Selections" daily)  ? ?  ?Labs   ?CBC: ?Recent Labs  ?Lab 02/15/22 ?2222  ?WBC 13.1*  ?HGB 11.6*  ?HCT 37.3*  ?MCV 94.7  ?PLT 326  ? ? ?Basic Metabolic Panel: ?Recent Labs  ?Lab 02/15/22 ?2222  ?NA 137  ?K 4.1  ?CL 106  ?CO2 24  ?GLUCOSE 179*  ?BUN 33*  ?CREATININE 1.64*  ?CALCIUM 8.6*  ? ?GFR: ?Estimated Creatinine Clearance: 44.4 mL/min (A) (by C-G formula based on SCr of 1.64 mg/dL (H)). ?Recent Labs  ?Lab 02/15/22 ?2222  ?WBC 13.1*  ? ? ?Liver Function Tests: ?Recent Labs  ?Lab 02/15/22 ?2222  ?AST 17  ?ALT 16  ?ALKPHOS 29*  ?BILITOT 0.8  ?PROT 6.7  ?ALBUMIN 3.0*  ? ?No results for input(s): LIPASE, AMYLASE in the last 168 hours. ?No results for input(s): AMMONIA in the last 168 hours. ? ?ABG ?No results found for: PHART, PCO2ART, PO2ART, HCO3, TCO2, ACIDBASEDEF, O2SAT  ? ?Coagulation Profile: ?No results for input(s): INR, PROTIME in the last 168 hours. ? ?Cardiac Enzymes: ?No results for input(s): CKTOTAL, CKMB, CKMBINDEX, TROPONINI in the last 168 hours. ? ?HbA1C: ?No results found for: HGBA1C ? ?CBG: ?No results for input(s): GLUCAP in the last 168 hours. ? ?Review of Systems:   ?Review of Systems  ?Constitutional: Negative.   ?HENT: Negative.    ?Eyes: Negative.   ?Respiratory:  Positive for shortness of breath.   ?Cardiovascular:  Positive for chest pain and leg swelling.  ?Gastrointestinal: Negative.   ?Musculoskeletal:  Positive for falls and joint pain.  ?Skin: Negative.   ?Neurological: Negative.   ?Endo/Heme/Allergies: Negative.   ?Psychiatric/Behavioral: Negative.    ? ? ?Past Medical History  ?He,  has a past medical history of Chronic kidney disease, stage 3a ([place]), History of asthma, Hyperlipidemia, Hypertension, Left ureteral calculus, and Type 2 diabetes mellitus ([place]).  ? ?Surgical History   ? ?Past Surgical History:  ?Procedure Laterality Date  ? CYSTOSCOPY WITH RETROGRADE PYELOGRAM, URETEROSCOPY AND STENT PLACEMENT Left 06/04/2013  ? Procedure: CYSTOSCOPY WITH  RETROGRADE PYELOGRAM, URETEROSCOPY AND STENT PLACEMENT;  Surgeon: Hanley Ben, MD;  Location: [place];  Service: Urology;  Laterality: Left;  1 HR ?  ? HOLMIUM LASER APPLICATION Left 0000000  ? Procedure: HOLMIUM LASER APPLICATION;  Surgeon: Hanley Ben, MD;  Location[facility];  Service: Urology;  Laterality: Left;  ? ORIF ANKLE FRACTURE Left 01/20/2022  ? Procedure: OPEN REDUCTION INTERNAL FIXATION (ORIF) ANKLE FRACTURE;  Surgeon: Thornton Park, MD;  Location: [facility];  Service: Orthopedics;  Laterality: Left;  ?  ? ?Social History  ? reports that he has never smoked. His smokeless tobacco use includes snuff. He reports that he does not drink alcohol and does not use drugs.  ? ?Family History   ?His family history includes Diabetes in his brother; Lung cancer in his father.  ? ?Allergies ?Allergies  ?Allergen Reactions  ? Amoxicillin Rash  ? Other Rash  ?  ? ?Home Medications  ?Prior to Admission medications   ?Medication Sig Start Date End Date Taking? Authorizing Provider  ?albuterol (VENTOLIN HFA) 108 (90 Base) MCG/ACT inhaler Inhale 2 puffs into the lungs in the morning, at noon, in the evening, and at bedtime. 10/29/21  Yes [provider]  ?aspirin EC 81 MG tablet Take 81 mg by mouth daily. Swallow whole.   Yes [provider]  ?atorvastatin (LIPITOR) 20 MG tablet Take 20 mg by mouth every morning.   Yes [provider]  ?benazepril-hydrochlorthiazide (LOTENSIN HCT) 10-12.5 MG per tablet Take 1 tablet by mouth every morning.   Yes [provider]  ?Cholecalciferol 25 MCG (1000 UT) capsule Take 1,000 Units by mouth daily.   Yes [provider]  ?cyanocobalamin 1000 MCG tablet Take 1,000 mcg by mouth daily.   Yes [provider]  ?fenofibrate 160 MG tablet Take 160 mg by mouth daily. 12/09/21  Yes [provider]  ?glimepiride (AMARYL) 4 MG tablet Take 4 mg by mouth 2 (two) times daily.   Yes [provider]  ?magnesium oxide (MAG-OX) 400 MG tablet Take 400 mg by mouth daily.   Yes [provider]  ?metFORMIN (GLUCOPHAGE) 1000 MG tablet Take 1,000 mg by mouth 2 (two) times daily with a meal.   Yes

## 2022-02-16 NOTE — Progress Notes (Signed)
ANTICOAGULATION CONSULT NOTE ? ?Pharmacy Consult for heparin infusion ?Indication: pulmonary embolus ? ?Allergies  ?Allergen Reactions  ? Amoxicillin Rash  ? Other Rash  ? ? ?Patient Measurements: ?Weight: 97.5 kg (215 lb) ?Heparin Dosing Weight: 83.1 kg ? ?Vital Signs: ?BP: 98/63 (05/06 1500) ?Pulse Rate: 84 (05/06 1500) ? ?Labs: ?Recent Labs  ?  02/15/22 ?0002 02/15/22 ?2222 02/16/22 ?0002 02/16/22 ?[id number] 02/16/22 ?1354  ?HGB  --  11.6*  --  11.3*  --   ?HCT  --  37.3*  --  35.5*  --   ?PLT  --  326  --  308  --   ?APTT >200*  --   --  56*  --   ?LABPROT 16.0*  --   --   --   --   ?INR 1.3*  --   --   --   --   ?HEPARINUNFRC  --   --   --  0.13* 0.17*  ?CREATININE  --  1.64*  --  1.50*  --   ?TROPONINIHS  --  223* 316*  --   --   ? ? ? ?Estimated Creatinine Clearance: 48.5 mL/min (A) (by C-G formula based on SCr of 1.5 mg/dL (H)). ? ? ?Medical History: ?Past Medical History:  ?Diagnosis Date  ? Chronic kidney disease, stage 3a ([place])   ? History of asthma   ? NO ISSUES SINCE THE 1990'S  ? Hyperlipidemia   ? Hypertension   ? Left ureteral calculus   ? Type 2 diabetes mellitus ([place])   ? ? ?Assessment: ?Pt is 71 yo male presenting to ED c/o sudden onset of central chest pain, SOB, and diaphoresis. ? ?Goal of Therapy:  ?Heparin level 0.3-0.7 units/ml ?Monitor platelets by anticoagulation protocol: Yes ? ?5/06 0504 HL 0.13, subtherapeutic ?5/06 1354 HL 0.17, subtherapeutic ?  ?Plan:  ?Bolus 2500 units x 1 ?Increase heparin infusion to 1550 units/hr ?Recheck HL in 8 hr after rate change ?CBC daily while on heparin ? ?Pearla Dubonnet, PharmD ?Clinical Pharmacist ?02/16/2022 ?3:10 PM ? ? ? ? ?

## 2022-02-16 NOTE — Consult Note (Signed)
? ?[facility] ? ?Patient name: Craig Donaldson MRN: [id number] DOB: [birth year] Sex: male ? ? ?REQUESTING PROVIDER:  ? ? ER ? ? ?REASON FOR CONSULT:  ?  ?Pulmonary embolism ? ?HISTORY OF PRESENT ILLNESS:  ? ?Craig Donaldson is a 71 y.o. male, who presented to the emergency department with shortness of breath.  He is status post ORIF of a left ankle fracture he sustained from a fall.  He has been immobilized in his left leg since that time.  He has been having progressive shortness of breath but got worse and so he came to the hospital.  Upon arrival he was hypotensive and hypoxic.  His heart rate was 100.  CT scan showed significant PE with right heart strain.  He was not requiring oxygen as his hypoxia improved as did his blood pressure with IV fluid resuscitation.  He is being admitted to the ICU. ? ?The patient suffers from chronic stage III renal insufficiency.  He is medically managed for hypertension.  He takes a statin for hypercholesterolemia.  He is a type II diabetic. ? ?PAST MEDICAL HISTORY  ? ? ?Past Medical History:  ?Diagnosis Date  ? Chronic kidney disease, stage 3a ([place])   ? History of asthma   ? NO ISSUES SINCE THE 1990'S  ? Hyperlipidemia   ? Hypertension   ? Left ureteral calculus   ? Type 2 diabetes mellitus ([place])   ? ? ? ?FAMILY HISTORY  ? ?Family History  ?Problem Relation Age of Onset  ? Lung cancer Father   ? Diabetes Brother   ? ? ?SOCIAL HISTORY:  ? ?Social History  ? ?Socioeconomic History  ? Marital status: Married  ?  Spouse name: Not on file  ? Number of children: Not on file  ? Years of education: Not on file  ? Highest education level: Not on file  ?Occupational History  ? Not on file  ?Tobacco Use  ? Smoking status: Never  ? Smokeless tobacco: Current  ?  Types: Snuff  ? Tobacco comments:  ?  35 YR TOBACCO USE  ?Substance and Sexual Activity  ? Alcohol use: No  ? Drug use: No  ? Sexual activity: Not on file  ?Other  Topics Concern  ? Not on file  ?Social History Narrative  ? Not on file  ? ?Social Determinants of Health  ? ?Financial Resource Strain: Not on file  ?Food Insecurity: Not on file  ?Transportation Needs: Not on file  ?Physical Activity: Not on file  ?Stress: Not on file  ?Social Connections: Not on file  ?Intimate Partner Violence: Not on file  ? ? ?ALLERGIES:  ? ? ?Allergies  ?Allergen Reactions  ? Amoxicillin Rash  ? Other Rash  ? ? ?CURRENT MEDICATIONS:  ? ? ?Current Facility-Administered Medications  ?Medication Dose Route Frequency Provider Last Rate Last Admin  ? budesonide (PULMICORT) nebulizer solution 0.25 mg  0.25 mg Nebulization BID Lang Snow, NP   0.25 mg at 02/16/22 [id number]  ? docusate sodium (COLACE) capsule 100 mg  100 mg Oral BID PRN Lang Snow, NP      ? heparin ADULT infusion 100 units/mL (25000 units/227mL)  1,300 Units/hr Intravenous Continuous Renda Rolls, RPH 13 mL/hr at 02/16/22 1230 1,300 Units/hr at 02/16/22 1230  ? insulin aspart (novoLOG) injection 0-9 Units  0-9 Units Subcutaneous Q4H Lang Snow, NP   1 Units at 02/16/22 1235  ? ipratropium-albuterol (DUONEB) 0.5-2.5 (3) MG/3ML nebulizer solution  3 mL  3 mL Nebulization Q6H PRN Lang Snow, NP      ? midodrine (PROAMATINE) tablet 10 mg  10 mg Oral TID WC Flora Lipps, MD   10 mg at 02/16/22 1239  ? polyethylene glycol (MIRALAX / GLYCOLAX) packet 17 g  17 g Oral Daily PRN Lang Snow, NP      ? ?Current Outpatient Medications  ?Medication Sig Dispense Refill  ? albuterol (VENTOLIN HFA) 108 (90 Base) MCG/ACT inhaler Inhale 2 puffs into the lungs in the morning, at noon, in the evening, and at bedtime.    ? aspirin EC 81 MG tablet Take 81 mg by mouth daily. Swallow whole.    ? atorvastatin (LIPITOR) 20 MG tablet Take 20 mg by mouth every morning.    ? benazepril-hydrochlorthiazide (LOTENSIN HCT) 10-12.5 MG per tablet Take 1 tablet by mouth every morning.    ? Cholecalciferol 25  MCG (1000 UT) capsule Take 1,000 Units by mouth daily.    ? cyanocobalamin 1000 MCG tablet Take 1,000 mcg by mouth daily.    ? fenofibrate 160 MG tablet Take 160 mg by mouth daily.    ? glimepiride (AMARYL) 4 MG tablet Take 4 mg by mouth 2 (two) times daily.    ? magnesium oxide (MAG-OX) 400 MG tablet Take 400 mg by mouth daily.    ? metFORMIN (GLUCOPHAGE) 1000 MG tablet Take 1,000 mg by mouth 2 (two) times daily with a meal.    ? metoprolol succinate (TOPROL-XL) 50 MG 24 hr tablet Take 25 mg by mouth every morning. Take with or immediately following a meal.    ? Omega-3 Fatty Acids (FISH OIL) 1000 MG CAPS Take 1 capsule by mouth daily.    ? pioglitazone (ACTOS) 15 MG tablet Take 15 mg by mouth daily.    ? Potassium Citrate,Elemental K, 99 MG CAPS Take 1 capsule by mouth daily.    ? senna (SENOKOT) 8.6 MG tablet Take 1 tablet by mouth daily.    ? TOUJEO MAX SOLOSTAR 300 UNIT/ML Solostar Pen Inject 66 Units into the skin daily.    ? docusate sodium (COLACE) 100 MG capsule Take 1 capsule (100 mg total) by mouth 2 (two) times daily. (Patient not taking: Reported on 02/16/2022) 10 capsule 0  ? linagliptin (TRADJENTA) 5 MG TABS tablet Take 5 mg by mouth daily. (Patient not taking: Reported on 02/16/2022)    ? ? ?REVIEW OF SYSTEMS:  ? ?[X]  denotes positive finding, [ ]  denotes negative finding ?Cardiac  Comments:  ?Chest pain or chest pressure:    ?Shortness of breath upon exertion: x   ?Short of breath when lying flat: x   ?Irregular heart rhythm:    ?    ?Vascular    ?Pain in calf, thigh, or hip brought on by ambulation:    ?Pain in feet at night that wakes you up from your sleep:     ?Blood clot in your veins:    ?Leg swelling:     ?    ?Pulmonary    ?Oxygen at home:    ?Productive cough:     ?Wheezing:     ?    ?Neurologic    ?Sudden weakness in arms or legs:     ?Sudden numbness in arms or legs:     ?Sudden onset of difficulty speaking or slurred speech:    ?Temporary loss of vision in one eye:     ?Problems with  dizziness:     ?    ?  Gastrointestinal    ?Blood in stool:     ? ?Vomited blood:     ?    ?Genitourinary    ?Burning when urinating:     ?Blood in urine:    ?    ?Psychiatric    ?Major depression:     ?    ?Hematologic    ?Bleeding problems:    ?Problems with blood clotting too easily:    ?    ?Skin    ?Rashes or ulcers:    ?    ?Constitutional    ?Fever or chills:    ? ?PHYSICAL EXAM:  ? ?Vitals:  ? 02/16/22 1100 02/16/22 1130 02/16/22 1200 02/16/22 1230  ?BP: 94/77 98/73 92/74  103/72  ?Pulse: 83 83 80 80  ?Resp: 20 (!) 23 18 19   ?Temp:      ?TempSrc:      ?SpO2: 92% 93% 93% 93%  ?Weight:      ? ? ?GENERAL: The patient is a well-nourished male, in no acute distress. The vital signs are documented above. ?CARDIAC: There is a regular rate and rhythm.  ?PULMONARY: Nonlabored respirations ?ABDOMEN: Soft and non-tender  ?MUSCULOSKELETAL: There are no major deformities or cyanosis. ?NEUROLOGIC: No focal weakness or paresthesias are detected. ?SKIN: There are no ulcers or rashes noted. ?PSYCHIATRIC: The patient has a normal affect. ? ?STUDIES:  ? ?I have reviewed the following: ? ?Venous doppler: ?Suspicion of clot within the right peroneal vein. The other calf ?veins and right lower extremity proximal veins appear patent. ?  ?Left leg negative through the popliteal vein. Calf veins cannot be ?evaluated because of the presence of a cast. ? ?CTA chest: ?1. Positive for acute bilateral PE with CT evidence of right heart ?strain (RV/LV Ratio = 1.9) consistent with at least submassive ?(intermediate risk) PE. The presence of right heart strain has been ?associated with an increased risk of morbidity and mortality. Please ?refer to the "PE Focused" order set in EPIC. ?2. Clear lung fields ?3. Ascending aortic aneurysm measuring up to 4.2 cm.  ?  ?  ? ?ASSESSMENT and PLAN  ? ?PE: The patient is being admitted to the ICU for IV heparin.  Currently he is breathing comfortably on room air with oxygen saturations greater than 95%.   His heart rate is less than 100 and his blood pressure has improved.  Given the large volume thrombus on his CT scan, I discussed proceeding with pulmonary thrombectomy on Monday to expedite his recovery.  He is

## 2022-02-16 NOTE — ED Notes (Signed)
Attempted to call and message RN IP for report. ?

## 2022-02-16 NOTE — ED Notes (Signed)
Pt sat up in stretcher and given lunch tray. ?

## 2022-02-16 NOTE — ED Notes (Signed)
Pt would like an update on [place] of [place]. Md messaged via secure chat. ?

## 2022-02-17 ENCOUNTER — Inpatient Hospital Stay
Admit: 2022-02-17 | Discharge: 2022-02-17 | Disposition: A | Discharge status: Home / Self Care | Payer: Medicare PPO | Attending: Internal Medicine | Admitting: Internal Medicine

## 2022-02-17 DIAGNOSIS — R7989 Other specified abnormal findings of blood chemistry: Secondary | ICD-10-CM | POA: Diagnosis present

## 2022-02-17 DIAGNOSIS — I7121 Aneurysm of the ascending aorta, without rupture: Secondary | ICD-10-CM | POA: Diagnosis present

## 2022-02-17 DIAGNOSIS — I2699 Other pulmonary embolism without acute cor pulmonale: Secondary | ICD-10-CM | POA: Diagnosis not present

## 2022-02-17 DIAGNOSIS — R778 Other specified abnormalities of plasma proteins: Secondary | ICD-10-CM | POA: Diagnosis present

## 2022-02-17 LAB — ECHOCARDIOGRAM COMPLETE
Area-P 1/2: 2.87 cm2
Height: 65 in
S' Lateral: 3.2 cm
Weight: 3358.05 oz

## 2022-02-17 LAB — PHOSPHORUS: Phosphorus: 3.1 mg/dL (ref 2.5–4.6)

## 2022-02-17 LAB — CBC
HCT: 34.4 % — ABNORMAL LOW (ref 39.0–52.0)
HCT: 33.8 % — ABNORMAL LOW (ref 39.0–52.0)
Hemoglobin: 11 g/dL — ABNORMAL LOW (ref 13.0–17.0)
Hemoglobin: 10.8 g/dL — ABNORMAL LOW (ref 13.0–17.0)
MCH: 29.8 pg (ref 26.0–34.0)
MCH: 30.1 pg (ref 26.0–34.0)
MCHC: 32 g/dL (ref 30.0–36.0)
MCHC: 32 g/dL (ref 30.0–36.0)
MCV: 93.2 fL (ref 80.0–100.0)
MCV: 94.2 fL (ref 80.0–100.0)
Platelets: 305 10*3/uL (ref 150–400)
Platelets: 304 10*3/uL (ref 150–400)
RBC: 3.69 MIL/uL — ABNORMAL LOW (ref 4.22–5.81)
RBC: 3.59 MIL/uL — ABNORMAL LOW (ref 4.22–5.81)
RDW: 15.4 % (ref 11.5–15.5)
RDW: 15.4 % (ref 11.5–15.5)
WBC: 11.3 10*3/uL — ABNORMAL HIGH (ref 4.0–10.5)
WBC: 11 10*3/uL — ABNORMAL HIGH (ref 4.0–10.5)
nRBC: 0 % (ref 0.0–0.2)
nRBC: 0 % (ref 0.0–0.2)

## 2022-02-17 LAB — BASIC METABOLIC PANEL
Anion gap: 8 (ref 5–15)
BUN: 26 mg/dL — ABNORMAL HIGH (ref 8–23)
CO2: 22 mmol/L (ref 22–32)
Calcium: 8 mg/dL — ABNORMAL LOW (ref 8.9–10.3)
Chloride: 108 mmol/L (ref 98–111)
Creatinine, Ser: 1.54 mg/dL — ABNORMAL HIGH (ref 0.61–1.24)
GFR, Estimated: 48 mL/min — ABNORMAL LOW (ref >60)
Glucose, Bld: 119 mg/dL — ABNORMAL HIGH (ref 70–99)
Potassium: 3.6 mmol/L (ref 3.5–5.1)
Sodium: 138 mmol/L (ref 135–145)

## 2022-02-17 LAB — HEPARIN LEVEL (UNFRACTIONATED)
Heparin Unfractionated: 0.37 IU/mL (ref 0.30–0.70)
Heparin Unfractionated: 0.31 IU/mL (ref 0.30–0.70)

## 2022-02-17 LAB — GLUCOSE, CAPILLARY
Glucose-Capillary: 122 mg/dL — ABNORMAL HIGH (ref 70–99)
Glucose-Capillary: 84 mg/dL (ref 70–99)
Glucose-Capillary: 127 mg/dL — ABNORMAL HIGH (ref 70–99)
Glucose-Capillary: 100 mg/dL — ABNORMAL HIGH (ref 70–99)
Glucose-Capillary: 108 mg/dL — ABNORMAL HIGH (ref 70–99)

## 2022-02-17 LAB — TROPONIN I (HIGH SENSITIVITY)
Troponin I (High Sensitivity): 65 ng/L — ABNORMAL HIGH (ref <18)
Troponin I (High Sensitivity): 64 ng/L — ABNORMAL HIGH (ref <18)

## 2022-02-17 LAB — MAGNESIUM: Magnesium: 2.2 mg/dL (ref 1.7–2.4)

## 2022-02-17 MED ORDER — ATORVASTATIN CALCIUM 20 MG PO TABS
20.0000 mg | ORAL_TABLET | Freq: Every morning | ORAL | Status: DC
  Administered 2022-02-19: 20 mg via ORAL
  Filled 2022-02-17: qty 1

## 2022-02-17 MED ORDER — SENNOSIDES-DOCUSATE SODIUM 8.6-50 MG PO TABS: 1.0000 | ORAL_TABLET | Freq: Every day | ORAL | Status: DC

## 2022-02-17 MED ORDER — FENOFIBRATE 160 MG PO TABS
160.0000 mg | ORAL_TABLET | Freq: Every day | ORAL | Status: DC
  Administered 2022-02-19: 160 mg via ORAL
  Filled 2022-02-17 (×2): qty 1

## 2022-02-17 MED ORDER — PERFLUTREN LIPID MICROSPHERE
1.0000 mL | INTRAVENOUS | Status: AC | PRN
  Administered 2022-02-17: 2 mL via INTRAVENOUS
  Filled 2022-02-17: qty 10

## 2022-02-17 MED ORDER — MAGNESIUM OXIDE 400 MG PO TABS
400.0000 mg | ORAL_TABLET | Freq: Every day | ORAL | Status: DC
  Administered 2022-02-19: 400 mg via ORAL
  Filled 2022-02-17 (×3): qty 1

## 2022-02-17 MED ORDER — SODIUM CHLORIDE 0.9% FLUSH
3.0000 mL | Freq: Two times a day (BID) | INTRAVENOUS | Status: DC
  Administered 2022-02-17 – 2022-02-19 (×4): 3 mL via INTRAVENOUS

## 2022-02-17 MED ORDER — INSULIN ASPART 100 UNIT/ML IJ SOLN
0.0000 [IU] | Freq: Three times a day (TID) | INTRAMUSCULAR | Status: DC
  Administered 2022-02-18 – 2022-02-19 (×2): 1 [IU] via SUBCUTANEOUS
  Administered 2022-02-19: 2 [IU] via SUBCUTANEOUS
  Filled 2022-02-17 (×3): qty 1

## 2022-02-17 MED ORDER — ALBUTEROL SULFATE HFA 108 (90 BASE) MCG/ACT IN AERS: 2.0000 | INHALATION_SPRAY | RESPIRATORY_TRACT | Status: DC | PRN

## 2022-02-17 MED ORDER — SODIUM CHLORIDE 0.9 % IV SOLN
1.0000 g | Freq: Once | INTRAVENOUS | Status: AC
  Administered 2022-02-17: 1 g via INTRAVENOUS
  Filled 2022-02-17: qty 10

## 2022-02-17 MED ORDER — VITAMIN D 25 MCG (1000 UNIT) PO TABS
1000.0000 [IU] | ORAL_TABLET | Freq: Every day | ORAL | Status: DC
  Administered 2022-02-17 – 2022-02-19 (×2): 1000 [IU] via ORAL
  Filled 2022-02-17 (×2): qty 1

## 2022-02-17 MED ORDER — SODIUM CHLORIDE 0.9 % IV SOLN
1.0000 g | INTRAVENOUS | Status: DC
  Administered 2022-02-17: 1 g via INTRAVENOUS
  Filled 2022-02-17: qty 10

## 2022-02-17 MED ORDER — SODIUM CHLORIDE 0.9 % IV SOLN
2.0000 g | INTRAVENOUS | Status: DC
  Filled 2022-02-17: qty 20

## 2022-02-17 MED ORDER — ALBUTEROL SULFATE (2.5 MG/3ML) 0.083% IN NEBU: 2.5000 mg | INHALATION_SOLUTION | RESPIRATORY_TRACT | Status: DC | PRN

## 2022-02-17 MED ORDER — SENNA 8.6 MG PO TABS
1.0000 | ORAL_TABLET | Freq: Every day | ORAL | Status: DC
  Administered 2022-02-17 – 2022-02-19 (×2): 8.6 mg via ORAL
  Filled 2022-02-17 (×2): qty 1

## 2022-02-17 MED ORDER — HEPARIN BOLUS VIA INFUSION
1200.0000 [IU] | Freq: Once | INTRAVENOUS | Status: AC
  Administered 2022-02-17: 1200 [IU] via INTRAVENOUS
  Filled 2022-02-17: qty 1200

## 2022-02-17 MED ORDER — VITAMIN B-12 1000 MCG PO TABS
1000.0000 ug | ORAL_TABLET | Freq: Every day | ORAL | Status: DC
  Administered 2022-02-17 – 2022-02-19 (×2): 1000 ug via ORAL
  Filled 2022-02-17 (×2): qty 1

## 2022-02-17 MED ORDER — SODIUM CHLORIDE 0.9 % IV SOLN
2.0000 g | INTRAVENOUS | Status: DC
  Administered 2022-02-18 – 2022-02-19 (×2): 2 g via INTRAVENOUS
  Filled 2022-02-17: qty 20
  Filled 2022-02-17: qty 2

## 2022-02-17 MED ORDER — INSULIN ASPART 100 UNIT/ML IJ SOLN: 0.0000 [IU] | Freq: Every day | INTRAMUSCULAR | Status: DC

## 2022-02-17 MED ORDER — SODIUM CHLORIDE 0.9 % IV SOLN
1.0000 g | Freq: Once | INTRAVENOUS | Status: DC
  Filled 2022-02-17: qty 10

## 2022-02-17 NOTE — Progress Notes (Signed)
ANTICOAGULATION CONSULT NOTE ? ?Pharmacy Consult for heparin infusion ?Indication: pulmonary embolus ? ?Allergies  ?Allergen Reactions  ? Amoxicillin Rash  ? Other Rash  ? ? ?Patient Measurements: ?Height: 5\' 5"  (165.1 cm) ?Weight: 94.8 kg (209 lb) ?IBW/kg (Calculated) : 61.5 ?Heparin Dosing Weight: 83.1 kg ? ?Vital Signs: ?Temp: 97.4 ?F (36.3 ?C) (05/06 2226) ?Temp Source: Oral (05/06 2226) ?BP: 114/68 (05/06 2226) ?Pulse Rate: 73 (05/06 2226) ? ?Labs: ?Recent Labs  ?  02/15/22 ?0002 02/15/22 ?2222 02/16/22 ?0002 02/16/22 ?[id number] 02/16/22 ?1354 02/16/22 ?2316  ?HGB  --  11.6*  --  11.3*  --   --   ?HCT  --  37.3*  --  35.5*  --   --   ?PLT  --  326  --  308  --   --   ?APTT >200*  --   --  56*  --   --   ?LABPROT 16.0*  --   --   --   --   --   ?INR 1.3*  --   --   --   --   --   ?HEPARINUNFRC  --   --   --  0.13* 0.17* 0.22*  ?CREATININE  --  1.64*  --  1.50*  --   --   ?TROPONINIHS  --  223* 316*  --   --   --   ? ? ? ?Estimated Creatinine Clearance: 47.8 mL/min (A) (by C-G formula based on SCr of 1.5 mg/dL (H)). ? ? ?Medical History: ?Past Medical History:  ?Diagnosis Date  ? Chronic kidney disease, stage 3a ([place])   ? History of asthma   ? NO ISSUES SINCE THE 1990'S  ? Hyperlipidemia   ? Hypertension   ? Left ureteral calculus   ? Type 2 diabetes mellitus ([place])   ? ? ?Assessment: ?Pt is 71 yo male presenting to ED c/o sudden onset of central chest pain, SOB, and diaphoresis. ? ?Goal of Therapy:  ?Heparin level 0.3-0.7 units/ml ?Monitor platelets by anticoagulation protocol: Yes ? ?5/06 0504 HL 0.13, subtherapeutic ?5/06 1354 HL 0.17, subtherapeutic ?5/06 2321 HL 0.22, subtherapeutic ?  ?Plan:  ?Bolus 1200 units x 1 ?Increase heparin infusion to 1700 units/hr ?Recheck HL in 8 hr after rate change ?CBC daily while on heparin ? ?Renda Rolls, PharmD, MBA ?02/17/2022 ?12:04 AM ? ? ? ? ? ?

## 2022-02-17 NOTE — Assessment & Plan Note (Signed)
Peaked at 316 and then started trending down.  Most likely secondary to demand ischemia with extensive PE and right heart strain. ?

## 2022-02-17 NOTE — Assessment & Plan Note (Signed)
CBG within goal. ?Patient was on metformin, Actos, Amaryl, Tradjenta at home. ?-SSI -switched to with meals and at night from every 4 hourly ?

## 2022-02-17 NOTE — Assessment & Plan Note (Signed)
Most likely reactive.  Urine cultures pending, UA was concerning for UTI. ?-Continue ceftriaxone ?

## 2022-02-17 NOTE — Assessment & Plan Note (Signed)
Secondary to fall, s/p ORIF at left ankle on 01/20/2022. ?-Outpatient orthopedic follow-up ?

## 2022-02-17 NOTE — Hospital Course (Addendum)
Taken from H&P and prior notes. ? ?71 y.o male with significant PMH of CKD stage III, hypertension, hyperlipidemia, asthma, type 2 diabetes mellitus, and recent left ankle fracture s/p ORIF who presented to the ED with chief complaints of non radiating chest pain. Patient describes a non radiating chest pain predominantly on the left sternal area that started yesterday at around 8 pm. Denies associated symptoms of nausea or vomiting.  ?  ?Of note, patient was recently admitted on 01/19/22 with fall and left ankle pain. Imaging studies on admission showed displaced fractures of the medial malleolus and distal fibula.  He was evaluated by orthopedic surgery and underwent ORIF on 01/20/2022 and discharged on 01/22/22. Since discharge, patient's family state his mobility has been limited and spent most of his time in a wheelchair. ? ?On arrival to ED he was hypothermic, tachycardic and hypotensive.  He was also hypoxic and appears pale and diaphoretic. ?Labs pertinent for elevated troponin at 223>>316, creatinine of 1.64, BNP 258. ?CTA chest with bilateral PE and CT evidence of right heart strain.  Also positive for an ascending aortic aneurysm measuring up to 4.2 cm. ? ?Patient was started on heparin infusion and admitted to ICU. ?Vascular surgery was also consulted and based on his extensive clot burden, he will be going for mechanical thrombectomy on Monday. ?Lower extremity venous Doppler with a small suspicious lesion in right peroneal, left upper leg was negative for any DVT but lower leg/calf cannot be tested because of the cast. ? ?Blood pressure and heart rate improved with IV boluses. ?TRH to resume care on 02/17/2022. ? ?5/7: Hemodynamically stable.  Most likely a provoked PE due to recent ORIF at left ankle and being immobilized for the past few weeks.  Will be going for mechanical thrombectomy with vascular surgery tomorrow.  Will need at least 3 months of anticoagulation afterwards. ?Currently on heparin  infusion. ? ?Echocardiogram with normal EF, severely dilated right ventricle and atrium with mildly reduced right ventricular systolic function, McConnell sign positive.  Also had severely dilated ascending aorta at 4.8 cm, mentioned as 4.2 cm on CT. ? ?5/8: Remains stable on heparin infusion.  Going for mechanical thrombectomy with vascular surgery today. ? ?5/9: Patient underwent successful mechanical thrombectomy and thrombolysis with tPA involving bilateral pulmonary vessels.  An extensive clot was removed.  Patient tolerated the procedure well.  Heparin was discontinued this morning and patient will be started on Xarelto.  He will need at least 3 months of anticoagulation due to provoked PE. ?His outpatient provider can determine the further need. ?Creatinine seems improving, at 1.34, hemoglobin dropped to 9.9, started him on iron supplement. ? ?Patient remained stable, able to ambulate without any difficulty and is being discharged on Xarelto.  He was also given 2 more days of Keflex for his pansensitive E. coli UTI. ? ?He is being  instructed to hold blood pressure medications and PCP can restart as needed. ? ?We will continue current medications and will follow-up with his providers. ?

## 2022-02-17 NOTE — Assessment & Plan Note (Signed)
Patient has significant ascending aortic aneurysm, on CTA it was measuring 4.2 and on echocardiogram it is 4.8. ?Vascular surgery is on board. ?No sign of dissection. ?-Continue to monitor ?

## 2022-02-17 NOTE — Progress Notes (Signed)
ANTICOAGULATION CONSULT NOTE ? ?Pharmacy Consult for heparin infusion ?Indication: pulmonary embolus ? ?Allergies  ?Allergen Reactions  ? Amoxicillin Rash  ? Other Rash  ? ? ?Patient Measurements: ?Height: 5\' 5"  (165.1 cm) ?Weight: 95.2 kg (209 lb 14.1 oz) ?IBW/kg (Calculated) : 61.5 ?Heparin Dosing Weight: 83.1 kg ? ?Vital Signs: ?Temp: 97.9 ?F (36.6 ?C) (05/07 1200) ?BP: 114/76 (05/07 1614) ?Pulse Rate: 68 (05/07 1614) ? ?Labs: ?Recent Labs  ?  02/15/22 ?0002 02/15/22 ?2222 02/15/22 ?2222 02/16/22 ?0002 02/16/22 ?[id number] 02/16/22 ?1354 02/16/22 ?2316 02/17/22 ?[id number] 02/17/22 ?0802 02/17/22 ?[id number] 02/17/22 ?1045 02/17/22 ?1609  ?HGB  --  11.6*   < >  --  11.3*  --   --  11.0*  --   --   --   --   ?HCT  --  37.3*  --   --  35.5*  --   --  34.4*  --   --   --   --   ?PLT  --  326  --   --  308  --   --  305  --   --   --   --   ?APTT >200*  --   --   --  56*  --   --   --   --   --   --   --   ?LABPROT 16.0*  --   --   --   --   --   --   --   --   --   --   --   ?INR 1.3*  --   --   --   --   --   --   --   --   --   --   --   ?HEPARINUNFRC  --   --   --   --  0.13*   < > 0.22*  --  0.37  --   --  0.31  ?CREATININE  --  1.64*  --   --  1.50*  --   --  1.54*  --   --   --   --   ?TROPONINIHS  --  223*   < > 316*  --   --   --   --   --  65* 64*  --   ? < > = values in this interval not displayed.  ? ? ? ?Estimated Creatinine Clearance: 46.7 mL/min (A) (by C-G formula based on SCr of 1.54 mg/dL (H)). ? ? ?Medical History: ?Past Medical History:  ?Diagnosis Date  ? Chronic kidney disease, stage 3a ([place])   ? History of asthma   ? NO ISSUES SINCE THE 1990'S  ? Hyperlipidemia   ? Hypertension   ? Left ureteral calculus   ? Type 2 diabetes mellitus ([place])   ? ? ?Assessment: ?Pt is 71 yo male presenting to ED c/o sudden onset of central chest pain, SOB, and diaphoresis. ? ?Goal of Therapy:  ?Heparin level 0.3-0.7 units/ml ?Monitor platelets by anticoagulation protocol: Yes ? ?5/06 0504 HL 0.13, subtherapeutic ?5/06 1354 HL 0.17,  subtherapeutic ?5/06 2321 HL 0.22, subtherapeutic ?5/07 0802 HL 0.37, therapeutic x 1 ?5/07 1609 HL 0.31, therapeutic x 2 ?  ?Plan:  ?Continue heparin infusion at 1700 units/hr ?Check HL with morning labs ?CBC daily while on heparin ? ?Michae Grimley Rodriguez-Guzman PharmD, BCPS ?02/17/2022 4:56 PM ? ? ? ? ? ?

## 2022-02-17 NOTE — Progress Notes (Addendum)
?Progress Note ? ? ?Patient: Craig Donaldson [id number] DOB: [birth year] DOA: 02/15/2022     1 ?DOS: the patient was seen and examined on 02/17/2022 ?  ?Brief hospital course: ?Taken from H&P and prior notes. ? ?71 y.o male with significant PMH of CKD stage III, hypertension, hyperlipidemia, asthma, type 2 diabetes mellitus, and recent left ankle fracture s/p ORIF who presented to the ED with chief complaints of non radiating chest pain. Patient describes a non radiating chest pain predominantly on the left sternal area that started yesterday at around 8 pm. Denies associated symptoms of nausea or vomiting.  ?  ?Of note, patient was recently admitted on 01/19/22 with fall and left ankle pain. Imaging studies on admission showed displaced fractures of the medial malleolus and distal fibula.  He was evaluated by orthopedic surgery and underwent ORIF on 01/20/2022 and discharged on 01/22/22. Since discharge, patient's family state his mobility has been limited and spent most of his time in a wheelchair. ? ?On arrival to ED he was hypothermic, tachycardic and hypotensive.  He was also hypoxic and appears pale and diaphoretic. ?Labs pertinent for elevated troponin at 223>>316, creatinine of 1.64, BNP 258. ?CTA chest with bilateral PE and CT evidence of right heart strain.  Also positive for an ascending aortic aneurysm measuring up to 4.2 cm. ? ?Patient was started on heparin infusion and admitted to ICU. ?Vascular surgery was also consulted and based on his extensive clot burden, he will be going for mechanical thrombectomy on Monday. ?Lower extremity venous Doppler with a small suspicious lesion in right peroneal, left upper leg was negative for any DVT but lower leg/calf cannot be tested because of the cast. ? ?Blood pressure and heart rate improved with IV boluses. ?TRH to resume care on 02/17/2022. ? ?5/7: Hemodynamically stable.  Most likely a provoked PE due to recent ORIF at left ankle and being immobilized for the past  few weeks.  Will be going for mechanical thrombectomy with vascular surgery tomorrow.  Will need at least 3 months of anticoagulation afterwards. ?Currently on heparin infusion. ? ?Echocardiogram with normal EF, severely dilated right ventricle and atrium with mildly reduced right ventricular systolic function, McConnell sign positive.  Also had severely dilated ascending aorta at 4.8 cm, mentioned as 4.2 cm on CT. ? ? ?Assessment and Plan: ?* Bilateral pulmonary embolism ([place]) ?Most likely had provoked PE secondary to recent left ankle fracture and immobilization.  Lower extremity venous Doppler with a suspicious opacity in right popliteal, left thigh veins are normal, unable to check calf veins due to presence of cast.  CTA with bilateral extensive PE with CT sign of right heart strain with RV/LV ratio equal to 1.9. ?Echocardiogram with severely dilated right ventricle and atrium, mildly reduced right ventricular systolic function with McConnell sign, normal EF. ?Vascular surgery was consulted and patient was placed on heparin infusion. ?Vascular surgery is planning to do mechanical thrombectomy and thrombolysis tomorrow morning. ?-Continue with heparin infusion. ?-Will be n.p.o. after midnight ?-Continue with supportive care and supplemental oxygen as needed ? ?Elevated troponin ?Peaked at 316 and then started trending down.  Most likely secondary to demand ischemia with extensive PE and right heart strain. ? ?Ascending aortic aneurysm ([place]) ?Patient has significant ascending aortic aneurysm, on CTA it was measuring 4.2 and on echocardiogram it is 4.8. ?Vascular surgery is on board. ?No sign of dissection. ?-Continue to monitor ? ?Hypertension ?Patient was hypotensive on admission, requiring multiple boluses. ?Currently blood pressure stable with midodrine. ?He was  on Lotensin and metoprolol at home ?-Keep holding home antihypertensives ? ?Diabetes mellitus without complication ([place]) ?CBG within goal. ?Patient  was on metformin, Actos, Amaryl, Tradjenta at home. ?-SSI -switched to with meals and at night from every 4 hourly ? ?Hyperlipidemia ?- Continue statin ? ?Closed left ankle fracture ?Secondary to fall, s/p ORIF at left ankle on 01/20/2022. ?-Outpatient orthopedic follow-up ? ?Chronic kidney disease, stage 3a ([place]) ?Seems stable and around his baseline. ?-Monitor renal function ?-Avoid nephrotoxins ? ?Leukocytosis ?Most likely reactive.  Urine cultures pending, UA was concerning for UTI. ?-Continue ceftriaxone ? ? ?Subjective: Patient was seen and examined today.  Denies any chest pain or shortness of breath.  Wife at bedside. ? ?Physical Exam: ?Vitals:  ? 02/16/22 2226 02/17/22 0510 02/17/22 0758 02/17/22 1200  ?BP: 114/68 113/75 121/82 114/78  ?Pulse: 73 76 77 72  ?Resp: 18 18 18 18   ?Temp: (!) 97.4 ?F (36.3 ?C) 97.6 ?F (36.4 ?C) 97.9 ?F (36.6 ?C) 97.9 ?F (36.6 ?C)  ?TempSrc: Oral     ?SpO2: 97% 98% 98% 97%  ?Weight: 94.8 kg 95.2 kg    ?Height: 5\' 5"  (1.651 m)     ? ?General.     In no acute distress. ?Pulmonary.  Lungs clear bilaterally, normal respiratory effort. ?CV.  Regular rate and rhythm, no JVD, rub or murmur. ?Abdomen.  Soft, nontender, nondistended, BS positive. ?CNS.  Alert and oriented .  No focal neurologic deficit. ?Extremities.  No edema, no cyanosis, pulses intact and symmetrical.  Left lower extremity with cast. ?Psychiatry.  Judgment and insight appears normal. ? ?Data Reviewed: ?Prior notes, labs and images reviewed ? ?Family Communication: Discussed with wife at bedside ? ?Disposition: ?Status is: Inpatient ?Remains inpatient appropriate because: Severity of illness ? ? Planned Discharge Destination: Home ? ?DVT prophylaxis.  Heparin infusion ?Time spent: 50 minutes ? ?This record has been created using Systems analyst. Errors have been sought and corrected,but may not always be located. Such creation errors do not reflect on the standard of care. ? ?Author: ?Lorella Nimrod,  MD ?02/17/2022 1:32 PM ? ?For on call review [email or web address].  ?

## 2022-02-17 NOTE — Assessment & Plan Note (Signed)
Continue statin. 

## 2022-02-17 NOTE — Assessment & Plan Note (Signed)
Patient was hypotensive on admission, requiring multiple boluses. ?Currently blood pressure stable with midodrine. ?He was on Lotensin and metoprolol at home ?-Keep holding home antihypertensives ?

## 2022-02-17 NOTE — Progress Notes (Signed)
ANTICOAGULATION CONSULT NOTE ? ?Pharmacy Consult for heparin infusion ?Indication: pulmonary embolus ? ?Allergies  ?Allergen Reactions  ? Amoxicillin Rash  ? Other Rash  ? ? ?Patient Measurements: ?Height: 5\' 5"  (165.1 cm) ?Weight: 95.2 kg (209 lb 14.1 oz) ?IBW/kg (Calculated) : 61.5 ?Heparin Dosing Weight: 83.1 kg ? ?Vital Signs: ?Temp: 97.9 ?F (36.6 ?C) (05/07 [id number]) ?Temp Source: Oral (05/06 2226) ?BP: 121/82 (05/07 0758) ?Pulse Rate: 77 (05/07 0758) ? ?Labs: ?Recent Labs  ?  02/15/22 ?0002 02/15/22 ?2222 02/15/22 ?2222 02/16/22 ?0002 02/16/22 ?[id number] 02/16/22 ?1354 02/16/22 ?2316 02/17/22 ?[id number] 02/17/22 ?0802  ?HGB  --  11.6*   < >  --  11.3*  --   --  11.0*  --   ?HCT  --  37.3*  --   --  35.5*  --   --  34.4*  --   ?PLT  --  326  --   --  308  --   --  305  --   ?APTT >200*  --   --   --  56*  --   --   --   --   ?LABPROT 16.0*  --   --   --   --   --   --   --   --   ?INR 1.3*  --   --   --   --   --   --   --   --   ?HEPARINUNFRC  --   --    < >  --  0.13* 0.17* 0.22*  --  0.37  ?CREATININE  --  1.64*  --   --  1.50*  --   --  1.54*  --   ?TROPONINIHS  --  223*  --  316*  --   --   --   --   --   ? < > = values in this interval not displayed.  ? ? ? ?Estimated Creatinine Clearance: 46.7 mL/min (A) (by C-G formula based on SCr of 1.54 mg/dL (H)). ? ? ?Medical History: ?Past Medical History:  ?Diagnosis Date  ? Chronic kidney disease, stage 3a ([place])   ? History of asthma   ? NO ISSUES SINCE THE 1990'S  ? Hyperlipidemia   ? Hypertension   ? Left ureteral calculus   ? Type 2 diabetes mellitus ([place])   ? ? ?Assessment: ?Pt is 71 yo male presenting to ED c/o sudden onset of central chest pain, SOB, and diaphoresis. ? ?Goal of Therapy:  ?Heparin level 0.3-0.7 units/ml ?Monitor platelets by anticoagulation protocol: Yes ? ?5/06 0504 HL 0.13, subtherapeutic ?5/06 1354 HL 0.17, subtherapeutic ?5/06 2321 HL 0.22, subtherapeutic ?5/07 0802 HL 0.37, therapeutic x1 ?  ?Plan:  ?Continue heparin infusion at 1700 units/hr ?Check  confirmatory HL in 8 hrs ?CBC daily while on heparin ? ?Pearla Dubonnet, PharmD ?Clinical Pharmacist ?02/17/2022 ?8:40 AM ? ? ? ? ? ? ?

## 2022-02-17 NOTE — Assessment & Plan Note (Signed)
Most likely had provoked PE secondary to recent left ankle fracture and immobilization.  Lower extremity venous Doppler with a suspicious opacity in right popliteal, left thigh veins are normal, unable to check calf veins due to presence of cast.  CTA with bilateral extensive PE with CT sign of right heart strain with RV/LV ratio equal to 1.9. ?Echocardiogram with severely dilated right ventricle and atrium, mildly reduced right ventricular systolic function with McConnell sign, normal EF. ?Vascular surgery was consulted and patient was placed on heparin infusion. ?Vascular surgery is planning to do mechanical thrombectomy and thrombolysis tomorrow morning. ?-Continue with heparin infusion. ?-Will be n.p.o. after midnight ?-Continue with supportive care and supplemental oxygen as needed ?

## 2022-02-17 NOTE — Assessment & Plan Note (Signed)
Seems stable and around his baseline. ?-Monitor renal function ?-Avoid nephrotoxins ?

## 2022-02-18 ENCOUNTER — Encounter
Admission: EM | Disposition: A | Discharge status: Home / Self Care | Payer: Self-pay | Source: Home / Self Care | Attending: Internal Medicine

## 2022-02-18 DIAGNOSIS — I2609 Other pulmonary embolism with acute cor pulmonale: Secondary | ICD-10-CM

## 2022-02-18 DIAGNOSIS — I2699 Other pulmonary embolism without acute cor pulmonale: Secondary | ICD-10-CM | POA: Diagnosis not present

## 2022-02-18 HISTORY — PX: PULMONARY THROMBECTOMY: CATH118295

## 2022-02-18 LAB — GLUCOSE, CAPILLARY
Glucose-Capillary: 97 mg/dL (ref 70–99)
Glucose-Capillary: 86 mg/dL (ref 70–99)
Glucose-Capillary: 75 mg/dL (ref 70–99)
Glucose-Capillary: 111 mg/dL — ABNORMAL HIGH (ref 70–99)
Glucose-Capillary: 127 mg/dL — ABNORMAL HIGH (ref 70–99)
Glucose-Capillary: 168 mg/dL — ABNORMAL HIGH (ref 70–99)

## 2022-02-18 LAB — HEMOGLOBIN A1C
Hgb A1c MFr Bld: 7 % — ABNORMAL HIGH (ref 4.8–5.6)
Mean Plasma Glucose: 154.2 mg/dL

## 2022-02-18 LAB — HEPARIN LEVEL (UNFRACTIONATED): Heparin Unfractionated: 0.31 IU/mL (ref 0.30–0.70)

## 2022-02-18 SURGERY — PULMONARY THROMBECTOMY
Anesthesia: Moderate Sedation | Laterality: Bilateral

## 2022-02-18 MED ORDER — FENTANYL CITRATE (PF) 100 MCG/2ML IJ SOLN
INTRAMUSCULAR | Status: DC | PRN
  Administered 2022-02-18: 50 ug via INTRAVENOUS

## 2022-02-18 MED ORDER — MIDAZOLAM HCL 5 MG/5ML IJ SOLN
INTRAMUSCULAR | Status: AC
  Filled 2022-02-18: qty 5

## 2022-02-18 MED ORDER — DEXTROSE 50 % IV SOLN
25.0000 mL | Freq: Once | INTRAVENOUS | Status: AC
Start: 2022-02-18 — End: 2022-02-18

## 2022-02-18 MED ORDER — ALTEPLASE 1 MG/ML SYRINGE FOR VASCULAR PROCEDURE
INTRAMUSCULAR | Status: DC | PRN
  Administered 2022-02-18: 8 mg via INTRA_ARTERIAL

## 2022-02-18 MED ORDER — HEPARIN SODIUM (PORCINE) 1000 UNIT/ML IJ SOLN
INTRAMUSCULAR | Status: AC
  Filled 2022-02-18: qty 10

## 2022-02-18 MED ORDER — ORAL CARE MOUTH RINSE
15.0000 mL | Freq: Two times a day (BID) | OROMUCOSAL | Status: DC
  Administered 2022-02-18 – 2022-02-19 (×3): 15 mL via OROMUCOSAL

## 2022-02-18 MED ORDER — FENTANYL CITRATE PF 50 MCG/ML IJ SOSY
PREFILLED_SYRINGE | INTRAMUSCULAR | Status: AC
  Filled 2022-02-18: qty 1

## 2022-02-18 MED ORDER — SODIUM CHLORIDE 0.9 % IV SOLN: INTRAVENOUS | Status: DC | PRN

## 2022-02-18 MED ORDER — DEXTROSE 50 % IV SOLN
INTRAVENOUS | Status: AC
  Administered 2022-02-18: 25 mL via INTRAVENOUS
  Filled 2022-02-18: qty 50

## 2022-02-18 MED ORDER — MIDAZOLAM HCL 2 MG/2ML IJ SOLN
INTRAMUSCULAR | Status: DC | PRN
  Administered 2022-02-18: 1 mg via INTRAVENOUS

## 2022-02-18 SURGICAL SUPPLY — 15 items
CANISTER PENUMBRA ENGINE (MISCELLANEOUS) ×1 IMPLANT
CATH ANGIO 5F PIGTAIL 100CM (CATHETERS) ×1 IMPLANT
CATH INDIGO 12XTORQ 100 (CATHETERS) ×1 IMPLANT
CATH INDIGO SEP 12 (CATHETERS) ×1 IMPLANT
CATH INFINITI JR4 5F (CATHETERS) ×1 IMPLANT
CATH SELECT BERN TIP 5F 130 (CATHETERS) ×1 IMPLANT
COVER PROBE U/S 5X48 (MISCELLANEOUS) ×1 IMPLANT
DEVICE SAFEGUARD 24CM (GAUZE/BANDAGES/DRESSINGS) ×1 IMPLANT
GLIDEWIRE ADV .035X260CM (WIRE) ×1 IMPLANT
PACK ANGIOGRAPHY (CUSTOM PROCEDURE TRAY) ×2 IMPLANT
SHEATH PINNACLE 11FRX10 (SHEATH) ×1 IMPLANT
SYR MEDRAD MARK 7 150ML (SYRINGE) ×1 IMPLANT
TUBING CONTRAST HIGH PRESS 72 (TUBING) ×1 IMPLANT
WIRE GUIDERIGHT .035X150 (WIRE) ×1 IMPLANT
WIRE MAGIC TORQUE 260C (WIRE) ×1 IMPLANT

## 2022-02-18 NOTE — Interval H&P Note (Signed)
History and Physical Interval Note: ? ?02/18/2022 ?3:00 PM ? ?Craig Donaldson  has presented today for surgery, with the diagnosis of PE.  The various methods of treatment have been discussed with the patient and family. After consideration of risks, benefits and other options for treatment, the patient has consented to  Procedure(s): ?PULMONARY THROMBECTOMY (Bilateral) as a surgical intervention.  The patient's history has been reviewed, patient examined, no change in status, stable for surgery.  I have reviewed the patient's chart and labs.  Questions were answered to the patient's satisfaction.   ? ? ?[name] ? ? ?

## 2022-02-18 NOTE — Op Note (Signed)
[facility] ? Percutaneous Study/Intervention Procedural Note ? ? ?Date of Surgery: 02/18/2022,4:19 PM ? ?Surgeon: Leotis Pain ? ?Pre-operative Diagnosis: Symptomatic bilateral pulmonary emboli ? ?Post-operative diagnosis:  Same ? ?Procedure(s) Performed: ? 1.  Contrast injection right heart ? 2.  Thrombolysis with 8 mg of tPA, 4 mg in each of the main pulmonary arteries ? 3.  Mechanical thrombectomy using the penumbra CAT 12 catheter to the left lower and upper lobe pulmonary arteries as well as the right lower, right middle, and right upper lobe pulmonary arteries ? 4.  Selective catheter placement right lower lobe, middle lobe, and upper lobe pulmonary artery ? 5.  Selective catheter placement left lower lobe and upper lobe pulmonary arteries ?  ? ?Anesthesia: Conscious sedation was administered under my direct supervision by the interventional radiology RN. IV Versed plus fentanyl were utilized. Continuous ECG, pulse oximetry and blood pressure was monitored throughout the entire procedure.  Versed and fentanyl were administered intravenously.  Conscious sedation was administered for a total of 35 minutes using 1 of Versed and 50 mcg of Fentanyl. ? ?EBL: 350 cc ? ?Sheath: 11 Fr right femoral vein ? ?Contrast: 80 cc  ? ?Fluoroscopy Time: 12.3 minutes ? ?Indications:  Patient presents with pulmonary emboli. The patient is symptomatic with hypoxemia and dyspnea on exertion.  There is evidence of right heart strain on the CT angiogram. The patient is otherwise a good candidate for intervention and even the long-term benefits pulmonary angiography with thrombolysis is offered. The risks and benefits are reviewed long-term benefits are discussed. All questions are answered patient agrees to proceed. ? ?Procedure:  SHANTELL DARKE a 71 y.o. male who was identified and appropriate procedural time out was performed.  The patient was then placed supine on the table and prepped and draped in the  usual sterile fashion.  Ultrasound was used to evaluate the right common femoral vein.  It was patent, as it was echolucent and compressible.  A digital ultrasound image was acquired for the permanent record.  A Seldinger needle was used to access the right common femoral vein under direct ultrasound guidance.  A 0.035 J wire was advanced without resistance and a 5Fr sheath was placed and then upsized to an 8 [place] sheath.   ? ?The wire and pigtail catheter were then negotiated into the right atrium and bolus injection of contrast was utilized to demonstrate the right ventricle and the pulmonary artery outflow. The advantage wire and JR4 catheter were then negotiated into the left main pulmonary artery and then first the left lower lobe and then left upper lobe where hand injection of contrast was utilized to demonstrate the pulmonary arteries and confirm the locations of the pulmonary emboli.  The left lower lobe had extensive thrombosis in the left upper lobe had moderate to extensive thrombus burden.  I then used the advantage wire and JR4 catheter to negotiate over to the right side.  I selectively cannulate first the right lower lobe, then the right middle and upper lobes and selective imaging was performed. Extensive thrombus was seen in the right lower lobe, right middle lobe, and right upper lobe pulmonary arteries ? ? ?TPA was reconstituted and delivered onto the table. A total of 8 milligrams of TPA was utilized.  This was delivered through the [facility] catheter.  4 mg was administered on the left side and 4 mg was administered on the right side. This was then allowed to dwell. ? ?The Penumbra Cat 12 catheter  was then advanced up into the pulmonary vasculature. The left lung was addressed first. Catheter was negotiated into the left lower lobe and mechanical thrombectomy was performed.  The separator was used as well.  Follow-up imaging demonstrated a good result and therefore the catheter was renegotiated  into the left upper lobe pulmonary artery and again mechanical thrombectomy was performed. Passes were made with both the Penumbra catheter itself as well as introducing the separator. Follow-up imaging was then performed.  A small amount of residual thrombus was seen both in the left upper and lower lobes and so I turned my attention to the right side. ? ?The Penumbra Cat 12 catheter was then negotiated to the opposite side with the help of the advantage wire and the Select catheter. The right lung was then addressed. Catheter was negotiated into the right lower lobe and mechanical thrombectomy was performed using a separator. Follow-up imaging demonstrated a good result and therefore the catheter was renegotiated into the right middle lobe pulmonary artery and again mechanical thrombectomy was performed. Passes were made with both the Penumbra catheter itself as well as introducing the separator.  Finally, I was able to negotiate the penumbra CAT 12 catheter up into the right upper lobe pulmonary artery and perform thrombectomy with the use of the separator.  Follow-up imaging was then performed.  Only a small amount of residual thrombus was seen in the main lobes on the right side and there was marked improvement in perfusion. ? ?After review these images wires were reintroduced and the catheters removed. Then, the sheath is then pulled and pressures held. A safeguard is placed. ? ? ? ?Findings:  ? Right heart imaging:  Right atrium and right ventricle and the pulmonary outflow tract appears moderately dilated ? Right lung: Extensive thrombus was seen in the right lower lobe, right middle lobe, and right upper lobe pulmonary arteries ? Left lung:  The left lower lobe had extensive thrombosis in the left upper lobe had moderate to extensive thrombus burden. ? ? ? ?Disposition: Patient was taken to the recovery room in stable condition having tolerated the procedure well. ? ?Leotis Pain ?02/18/2022,4:19 PM  ?

## 2022-02-18 NOTE — Progress Notes (Signed)
Dr. Wyn Quaker at bedside, speaking with pt. And wife/daughter re: procedural results.Marland Kitchen all verbalized understanding of conversation.  ?

## 2022-02-18 NOTE — Progress Notes (Signed)
ANTICOAGULATION CONSULT NOTE ? ?Pharmacy Consult for heparin infusion ?Indication: pulmonary embolus ? ?Allergies  ?Allergen Reactions  ? Amoxicillin Rash  ? Other Rash  ? ? ?Patient Measurements: ?Height: 5\' 5"  (165.1 cm) ?Weight: 93.5 kg (206 lb 2.1 oz) ?IBW/kg (Calculated) : 61.5 ?Heparin Dosing Weight: 83.1 kg ? ?Vital Signs: ?Temp: 98 ?F (36.7 ?C) (05/08 0434) ?BP: 117/76 (05/08 0434) ?Pulse Rate: 66 (05/08 0434) ? ?Labs: ?Recent Labs  ?  02/15/22 ?2222 02/16/22 ?0002 02/16/22 ?[id number] 02/16/22 ?1354 02/17/22 ?[id number] 02/17/22 ?0802 02/17/22 ?[id number] 02/17/22 ?1045 02/17/22 ?1609 02/17/22 ?1709 02/18/22 ?0433  ?HGB 11.6*  --  11.3*  --  11.0*  --   --   --   --  10.8*  --   ?HCT 37.3*  --  35.5*  --  34.4*  --   --   --   --  33.8*  --   ?PLT 326  --  308  --  305  --   --   --   --  304  --   ?APTT  --   --  56*  --   --   --   --   --   --   --   --   ?HEPARINUNFRC  --   --  0.13*   < >  --  0.37  --   --  0.31  --  0.31  ?CREATININE 1.64*  --  1.50*  --  1.54*  --   --   --   --   --   --   ?TROPONINIHS 223* 316*  --   --   --   --  65* 64*  --   --   --   ? < > = values in this interval not displayed.  ? ? ? ?Estimated Creatinine Clearance: 46.2 mL/min (A) (by C-G formula based on SCr of 1.54 mg/dL (H)). ? ? ?Medical History: ?Past Medical History:  ?Diagnosis Date  ? Chronic kidney disease, stage 3a ([place])   ? History of asthma   ? NO ISSUES SINCE THE 1990'S  ? Hyperlipidemia   ? Hypertension   ? Left ureteral calculus   ? Type 2 diabetes mellitus ([place])   ? ? ?Assessment: ?Pt is 71 yo male presenting to ED c/o sudden onset of central chest pain, SOB, and diaphoresis. ? ?Goal of Therapy:  ?Heparin level 0.3-0.7 units/ml ?Monitor platelets by anticoagulation protocol: Yes ? ?5/06 0504 HL 0.13, subtherapeutic ?5/06 1354 HL 0.17, subtherapeutic ?5/06 2321 HL 0.22, subtherapeutic ?5/07 0802 HL 0.37, therapeutic x 1 ?5/07 1609 HL 0.31, therapeutic x 2 ?5/08 0433 HL 0.31, therapeutic x 3 ?  ?Plan:  ?Continue heparin  infusion at 1700 units/hr ?Check HL with morning labs ?CBC daily while on heparin ? ?Renda Rolls, PharmD, MBA ?02/18/2022 ?6:41 AM ? ? ? ? ? ? ?

## 2022-02-18 NOTE — Assessment & Plan Note (Signed)
UA was concerning for UTI.  Urine cultures with more than 100,000 colonies of E. coli, pending susceptibility. ?-Continue ceftriaxone ?

## 2022-02-18 NOTE — Progress Notes (Signed)
Nutrition Brief Note ? ?Patient identified on the Malnutrition Screening Tool (MST) Report ? ?Wt Readings from Last 15 Encounters:  ?02/18/22 93.5 kg  ?01/19/22 86.2 kg  ?01/20/20 93 kg  ?06/08/19 90.7 kg  ?04/07/16 90.3 kg  ?06/04/13 90.3 kg  ? ?Pt with PMH of CKD stage III, hypertension, hyperlipidemia, asthma, type 2 diabetes mellitus, and recent left ankle fracture s/p ORIF admitted with complaints of chest pain.  ? ?Pt admitted with bilateral pulmonary embolism (likely provoked from recent lt ankle fracture and immobilization).  ? ?Pt unavailable at time of visit. Pt in with MD at time of visit. RD unable to obtain further nutrition-related history or complete nutrition-focused physical exam at this time.   ? ?Pt currently NPO for mechanical thrombectomy today.  ? ?Pt familiar to this RD from recent hospitalization. Pt with history of good appetite at home and was active prior to acute illness.  ? ?Reviewed wt hx; wt has been stable over the past year.  ? ?Medications reviewed and include vitamin D3, magnesium oxide, and senokot. ? ?Lab Results  ?Component Value Date  ? HGBA1C 7.0 (H) 02/16/2022  ?  PTA DM medications are 4 mg glimepiride BID, 1000 mg metformin BID, 66 units tuojeo max solostar daily, and 5 mg linagliptin daily.  ? ?Labs reviewed: CBGS: 97-108 (inpatient orders for glycemic control are 0-5 units insulin aspart daily at bedtime and 0-9 units insulin aspart TID with meals).   ? ?Current diet order is NPO (previously on a regular diet) , patient is consuming approximately 50-90% of meals at this time. Labs and medications reviewed.  ? ?No nutrition interventions warranted at this time. If nutrition issues arise, please consult RD.  ? ?Loistine Chance, RD, LDN, CDCES ?Registered Dietitian II ?Certified Diabetes Care and Education Specialist ?Please refer to [facility] for RD and/or RD on-call/weekend/after hours pager   ?

## 2022-02-18 NOTE — Progress Notes (Signed)
?Progress Note ? ? ?Patient: Craig Donaldson [id number] DOB: 1951-09-21 DOA: 02/15/2022     2 ?DOS: the patient was seen and examined on 02/18/2022 ?  ?Brief hospital course: ?Taken from H&P and prior notes. ? ?71 y.o male with significant PMH of CKD stage III, hypertension, hyperlipidemia, asthma, type 2 diabetes mellitus, and recent left ankle fracture s/p ORIF who presented to the ED with chief complaints of non radiating chest pain. Patient describes a non radiating chest pain predominantly on the left sternal area that started yesterday at around 8 pm. Denies associated symptoms of nausea or vomiting.  ?  ?Of note, patient was recently admitted on 01/19/22 with fall and left ankle pain. Imaging studies on admission showed displaced fractures of the medial malleolus and distal fibula.  He was evaluated by orthopedic surgery and underwent ORIF on 01/20/2022 and discharged on 01/22/22. Since discharge, patient's family state his mobility has been limited and spent most of his time in a wheelchair. ? ?On arrival to ED he was hypothermic, tachycardic and hypotensive.  He was also hypoxic and appears pale and diaphoretic. ?Labs pertinent for elevated troponin at 223>>316, creatinine of 1.64, BNP 258. ?CTA chest with bilateral PE and CT evidence of right heart strain.  Also positive for an ascending aortic aneurysm measuring up to 4.2 cm. ? ?Patient was started on heparin infusion and admitted to ICU. ?Vascular surgery was also consulted and based on his extensive clot burden, he will be going for mechanical thrombectomy on Monday. ?Lower extremity venous Doppler with a small suspicious lesion in right peroneal, left upper leg was negative for any DVT but lower leg/calf cannot be tested because of the cast. ? ?Blood pressure and heart rate improved with IV boluses. ?TRH to resume care on 02/17/2022. ? ?5/7: Hemodynamically stable.  Most likely a provoked PE due to recent ORIF at left ankle and being immobilized for the past  few weeks.  Will be going for mechanical thrombectomy with vascular surgery tomorrow.  Will need at least 3 months of anticoagulation afterwards. ?Currently on heparin infusion. ? ?Echocardiogram with normal EF, severely dilated right ventricle and atrium with mildly reduced right ventricular systolic function, McConnell sign positive.  Also had severely dilated ascending aorta at 4.8 cm, mentioned as 4.2 cm on CT. ? ?5/8: Remains stable on heparin infusion.  Going for mechanical thrombectomy with vascular surgery today. ? ? ?Assessment and Plan: ?* Bilateral pulmonary embolism ([place]) ?Most likely had provoked PE secondary to recent left ankle fracture and immobilization.  Lower extremity venous Doppler with a suspicious opacity in right popliteal, left thigh veins are normal, unable to check calf veins due to presence of cast.  CTA with bilateral extensive PE with CT sign of right heart strain with RV/LV ratio equal to 1.9. ?Echocardiogram with severely dilated right ventricle and atrium, mildly reduced right ventricular systolic function with McConnell sign, normal EF. ?Vascular surgery was consulted and patient was placed on heparin infusion. ?Going for mechanical thrombectomy and thrombolysis with vascular surgery today ?-Continue with heparin infusion. ?-Continue with supportive care and supplemental oxygen as needed ? ?Elevated troponin ?Peaked at 316 and then started trending down.  Most likely secondary to demand ischemia with extensive PE and right heart strain. ? ?Ascending aortic aneurysm ([place]) ?Patient has significant ascending aortic aneurysm, on CTA it was measuring 4.2 and on echocardiogram it is 4.8. ?Vascular surgery is on board. ?No sign of dissection. ?-Continue to monitor ? ?Hypertension ?Patient was hypotensive on admission, requiring multiple  boluses. ?Currently blood pressure stable with midodrine. ?He was on Lotensin and metoprolol at home ?-Keep holding home antihypertensives ? ?Diabetes  mellitus without complication ([place]) ?CBG within goal. ?Patient was on metformin, Actos, Amaryl, Tradjenta at home. ?-SSI -switched to with meals and at night from every 4 hourly ? ?Hyperlipidemia ?- Continue statin ? ?Closed left ankle fracture ?Secondary to fall, s/p ORIF at left ankle on 01/20/2022. ?-Outpatient orthopedic follow-up ? ?Chronic kidney disease, stage 3a ([place]) ?Seems stable and around his baseline. ?-Monitor renal function ?-Avoid nephrotoxins ? ?Leukocytosis ? UA was concerning for UTI.  Urine cultures with more than 100,000 colonies of E. coli, pending susceptibility. ?-Continue ceftriaxone ? ?Subjective: Patient was seen and examined today.  No new complaints.  Denies any chest pain or shortness of breath.  Wife at bedside. ? ?Physical Exam: ?Vitals:  ? 02/18/22 0547 02/18/22 0743 02/18/22 1209 02/18/22 1454  ?BP:  117/75 124/75 109/65  ?Pulse:  66 72 74  ?Resp:  16 18 20   ?Temp:  97.6 ?F (36.4 ?C) (!) 97.5 ?F (36.4 ?C) 98.1 ?F (36.7 ?C)  ?TempSrc:    Oral  ?SpO2:  99% 99% 94%  ?Weight: 93.5 kg   93.5 kg  ?Height:    5\' 5"  (1.651 m)  ? ?General.     In no acute distress. ?Pulmonary.  Lungs clear bilaterally, normal respiratory effort. ?CV.  Regular rate and rhythm, no JVD, rub or murmur. ?Abdomen.  Soft, nontender, nondistended, BS positive. ?CNS.  Alert and oriented .  No focal neurologic deficit. ?Extremities.  No edema, no cyanosis, pulses intact and symmetrical.  Left leg with cast ?Psychiatry.  Judgment and insight appears normal. ? ?Data Reviewed: ?Prior notes and labs reviewed ? ?Family Communication: Discussed with wife at bedside ? ?Disposition: ?Status is: Inpatient ?Remains inpatient appropriate because: Severity of illness ? ? Planned Discharge Destination: Home with Home Health ? ?DVT prophylaxis.  Heparin infusion ?Time spent:45 minutes ? ?This record has been created using Systems analyst. Errors have been sought and corrected,but may not always be located. Such  creation errors do not reflect on the standard of care. ? ?Author: ?Lorella Nimrod, MD ?02/18/2022 3:00 PM ? ?For on call review [email or web address].  ?

## 2022-02-18 NOTE — Assessment & Plan Note (Signed)
Most likely had provoked PE secondary to recent left ankle fracture and immobilization.  Lower extremity venous Doppler with a suspicious opacity in right popliteal, left thigh veins are normal, unable to check calf veins due to presence of cast.  CTA with bilateral extensive PE with CT sign of right heart strain with RV/LV ratio equal to 1.9. ?Echocardiogram with severely dilated right ventricle and atrium, mildly reduced right ventricular systolic function with McConnell sign, normal EF. ?Vascular surgery was consulted and patient was placed on heparin infusion. ?Going for mechanical thrombectomy and thrombolysis with vascular surgery today ?-Continue with heparin infusion. ?-Continue with supportive care and supplemental oxygen as needed ?

## 2022-02-19 ENCOUNTER — Encounter: Payer: Self-pay | Admitting: Vascular Surgery

## 2022-02-19 DIAGNOSIS — I2699 Other pulmonary embolism without acute cor pulmonale: Secondary | ICD-10-CM | POA: Diagnosis not present

## 2022-02-19 LAB — CBC
HCT: 30.8 % — ABNORMAL LOW (ref 39.0–52.0)
Hemoglobin: 9.9 g/dL — ABNORMAL LOW (ref 13.0–17.0)
MCH: 29.6 pg (ref 26.0–34.0)
MCHC: 32.1 g/dL (ref 30.0–36.0)
MCV: 92.2 fL (ref 80.0–100.0)
Platelets: 259 10*3/uL (ref 150–400)
RBC: 3.34 MIL/uL — ABNORMAL LOW (ref 4.22–5.81)
RDW: 15.3 % (ref 11.5–15.5)
WBC: 9 10*3/uL (ref 4.0–10.5)
nRBC: 0 % (ref 0.0–0.2)

## 2022-02-19 LAB — BASIC METABOLIC PANEL
Anion gap: 5 (ref 5–15)
BUN: 18 mg/dL (ref 8–23)
CO2: 22 mmol/L (ref 22–32)
Calcium: 8.1 mg/dL — ABNORMAL LOW (ref 8.9–10.3)
Chloride: 108 mmol/L (ref 98–111)
Creatinine, Ser: 1.34 mg/dL — ABNORMAL HIGH (ref 0.61–1.24)
GFR, Estimated: 57 mL/min — ABNORMAL LOW (ref >60)
Glucose, Bld: 177 mg/dL — ABNORMAL HIGH (ref 70–99)
Potassium: 4 mmol/L (ref 3.5–5.1)
Sodium: 135 mmol/L (ref 135–145)

## 2022-02-19 LAB — URINE CULTURE: Culture: >=100000 — AB

## 2022-02-19 LAB — GLUCOSE, CAPILLARY
Glucose-Capillary: 149 mg/dL — ABNORMAL HIGH (ref 70–99)
Glucose-Capillary: 169 mg/dL — ABNORMAL HIGH (ref 70–99)

## 2022-02-19 LAB — HEPARIN LEVEL (UNFRACTIONATED): Heparin Unfractionated: 0.19 IU/mL — ABNORMAL LOW (ref 0.30–0.70)

## 2022-02-19 MED ORDER — RIVAROXABAN 20 MG PO TABS: 20.0000 mg | ORAL_TABLET | Freq: Every day | ORAL | Status: DC

## 2022-02-19 MED ORDER — FE FUMARATE-B12-VIT C-FA-IFC PO CAPS: 1.0000 | ORAL_CAPSULE | Freq: Two times a day (BID) | ORAL | 2 refills | Status: AC

## 2022-02-19 MED ORDER — CEPHALEXIN 500 MG PO CAPS: 500.0000 mg | ORAL_CAPSULE | Freq: Two times a day (BID) | ORAL | 0 refills | Status: AC

## 2022-02-19 MED ORDER — BENAZEPRIL-HYDROCHLOROTHIAZIDE 10-12.5 MG PO TABS: 1.0000 | ORAL_TABLET | Freq: Every morning | ORAL | Status: DC

## 2022-02-19 MED ORDER — RIVAROXABAN 20 MG PO TABS: 20.0000 mg | ORAL_TABLET | Freq: Every day | ORAL | 2 refills | Status: DC

## 2022-02-19 MED ORDER — CEPHALEXIN 500 MG PO CAPS: 500.0000 mg | ORAL_CAPSULE | Freq: Two times a day (BID) | ORAL | Status: DC

## 2022-02-19 MED ORDER — RIVAROXABAN (XARELTO) VTE STARTER PACK (15 & 20 MG): ORAL_TABLET | ORAL | 0 refills | Status: DC

## 2022-02-19 MED ORDER — RIVAROXABAN 15 MG PO TABS
15.0000 mg | ORAL_TABLET | Freq: Two times a day (BID) | ORAL | Status: DC
  Administered 2022-02-19: 15 mg via ORAL
  Filled 2022-02-19 (×2): qty 1

## 2022-02-19 MED ORDER — HEPARIN BOLUS VIA INFUSION
2500.0000 [IU] | Freq: Once | INTRAVENOUS | Status: AC
  Administered 2022-02-19: 2500 [IU] via INTRAVENOUS
  Filled 2022-02-19: qty 2500

## 2022-02-19 MED ORDER — FE FUMARATE-B12-VIT C-FA-IFC PO CAPS
1.0000 | ORAL_CAPSULE | Freq: Two times a day (BID) | ORAL | Status: DC
  Administered 2022-02-19: 1 via ORAL
  Filled 2022-02-19 (×2): qty 1

## 2022-02-19 NOTE — Progress Notes (Signed)
Discharge instructions provided to patient. All medications, follow up appointments, and discharge instructions discussed. IV out. Monitor off CCMD notified. Discharging home with wife.  ?Sabra Heck, RN  ?

## 2022-02-19 NOTE — Plan of Care (Signed)

## 2022-02-19 NOTE — Progress Notes (Signed)
ANTICOAGULATION CONSULT NOTE ? ?Pharmacy Consult for heparin infusion ?Indication: pulmonary embolus ? ?Allergies  ?Allergen Reactions  ? Amoxicillin Rash  ? Other Rash  ? ? ?Patient Measurements: ?Height: 5\' 5"  (165.1 cm) ?Weight: 90.9 kg (200 lb 6.4 oz) ?IBW/kg (Calculated) : 61.5 ?Heparin Dosing Weight: 83.1 kg ? ?Vital Signs: ?Temp: 98.4 ?F (36.9 ?C) (05/09 0500) ?Temp Source: Oral (05/09 0500) ?BP: 121/72 (05/09 0500) ?Pulse Rate: 72 (05/09 0500) ? ?Labs: ?Recent Labs  ?  02/17/22 ?[id number] 02/17/22 ?0802 02/17/22 ?[id number] 02/17/22 ?1045 02/17/22 ?1609 02/17/22 ?1709 02/18/22 ?[id number] 02/19/22 ?[id number]  ?HGB 11.0*  --   --   --   --  10.8*  --  9.9*  ?HCT 34.4*  --   --   --   --  33.8*  --  30.8*  ?PLT 305  --   --   --   --  304  --  259  ?HEPARINUNFRC  --    < >  --   --  0.31  --  0.31 0.19*  ?CREATININE 1.54*  --   --   --   --   --   --  1.34*  ?TROPONINIHS  --   --  65* 64*  --   --   --   --   ? < > = values in this interval not displayed.  ? ? ? ?Estimated Creatinine Clearance: 52.4 mL/min (A) (by C-G formula based on SCr of 1.34 mg/dL (H)). ? ? ?Medical History: ?Past Medical History:  ?Diagnosis Date  ? Chronic kidney disease, stage 3a ([place])   ? History of asthma   ? NO ISSUES SINCE THE 1990'S  ? Hyperlipidemia   ? Hypertension   ? Left ureteral calculus   ? Type 2 diabetes mellitus ([place])   ? ? ?Assessment: ?Pt is 71 yo male presenting to ED c/o sudden onset of central chest pain, SOB, and diaphoresis. ? ?Goal of Therapy:  ?Heparin level 0.3-0.7 units/ml ?Monitor platelets by anticoagulation protocol: Yes ? ?5/06 0504 HL 0.13, subtherapeutic ?5/06 1354 HL 0.17, subtherapeutic ?5/06 2321 HL 0.22, subtherapeutic ?5/07 0802 HL 0.37, therapeutic x 1 ?5/07 1609 HL 0.31, therapeutic x 2 ?5/08 0433 HL 0.31, therapeutic x 3 ?5/09 0652 HL 0.19, subtherapeutic ?  ?Plan:  ?Heparin level subtherapeutic. Give 2500 unit bolus and increase heparin infusion to 2000 units/hr. Recheck HL 8 hours after rate change. CBC daily while  on heparin. ? ? ?Sherilyn Banker, PharmD ?Clinical Pharmacist  ?02/19/2022 7:34 AM  ? ? ? ? ? ? ? ?

## 2022-02-19 NOTE — Discharge Summary (Signed)
?Physician Discharge Summary ?  ?Patient: Craig Donaldson MRN: [id number] DOB: Jul 03, 1951  ?Admit date:     02/15/2022  ?Discharge date: 02/19/22  ?Discharge Physician: Lorella Nimrod  ? ?PCP: Dion Body, MD  ? ?Recommendations at discharge:  ?Please obtain CBC and BMP in 1 week ?His home combination blood pressure medicine is being held, please resume if blood pressure improved. ?Patient is being discharged on Xarelto which he will require at least for 105-month for provoked PE. ?Follow-up with vascular surgery ?Follow-up with primary care provider ? ?Discharge Diagnoses: ?Principal Problem: ?  Bilateral pulmonary embolism ([place]) ?Active Problems: ?  Ascending aortic aneurysm ([place]) ?  Elevated troponin ?  Hypertension ?  Diabetes mellitus without complication ([place]) ?  Hyperlipidemia ?  Closed left ankle fracture ?  Chronic kidney disease, stage 3a ([place]) ?  Leukocytosis ? ?Resolved Problems: ?  Pulmonary embolism ([place]) ? ?Hospital Course: ?Taken from H&P and prior notes. ? ?71 y.o male with significant PMH of CKD stage III, hypertension, hyperlipidemia, asthma, type 2 diabetes mellitus, and recent left ankle fracture s/p ORIF who presented to the ED with chief complaints of non radiating chest pain. Patient describes a non radiating chest pain predominantly on the left sternal area that started yesterday at around 8 pm. Denies associated symptoms of nausea or vomiting.  ?  ?Of note, patient was recently admitted on 01/19/22 with fall and left ankle pain. Imaging studies on admission showed displaced fractures of the medial malleolus and distal fibula.  He was evaluated by orthopedic surgery and underwent ORIF on 01/20/2022 and discharged on 01/22/22. Since discharge, patient's family state his mobility has been limited and spent most of his time in a wheelchair. ? ?On arrival to ED he was hypothermic, tachycardic and hypotensive.  He was also hypoxic and appears pale and diaphoretic. ?Labs pertinent for elevated  troponin at 223>>316, creatinine of 1.64, BNP 258. ?CTA chest with bilateral PE and CT evidence of right heart strain.  Also positive for an ascending aortic aneurysm measuring up to 4.2 cm. ? ?Patient was started on heparin infusion and admitted to ICU. ?Vascular surgery was also consulted and based on his extensive clot burden, he will be going for mechanical thrombectomy on Monday. ?Lower extremity venous Doppler with a small suspicious lesion in right peroneal, left upper leg was negative for any DVT but lower leg/calf cannot be tested because of the cast. ? ?Blood pressure and heart rate improved with IV boluses. ?TRH to resume care on 02/17/2022. ? ?5/7: Hemodynamically stable.  Most likely a provoked PE due to recent ORIF at left ankle and being immobilized for the past few weeks.  Will be going for mechanical thrombectomy with vascular surgery tomorrow.  Will need at least 3 months of anticoagulation afterwards. ?Currently on heparin infusion. ? ?Echocardiogram with normal EF, severely dilated right ventricle and atrium with mildly reduced right ventricular systolic function, McConnell sign positive.  Also had severely dilated ascending aorta at 4.8 cm, mentioned as 4.2 cm on CT. ? ?5/8: Remains stable on heparin infusion.  Going for mechanical thrombectomy with vascular surgery today. ? ?5/9: Patient underwent successful mechanical thrombectomy and thrombolysis with tPA involving bilateral pulmonary vessels.  An extensive clot was removed.  Patient tolerated the procedure well.  Heparin was discontinued this morning and patient will be started on Xarelto.  He will need at least 3 months of anticoagulation due to provoked PE. ?His outpatient provider can determine the further need. ?Creatinine seems improving, at 1.34, hemoglobin  dropped to 9.9, started him on iron supplement. ? ?Patient remained stable, able to ambulate without any difficulty and is being discharged on Xarelto.  He was also given 2 more days  of Keflex for his pansensitive E. coli UTI. ? ?He is being  instructed to hold blood pressure medications and PCP can restart as needed. ? ?We will continue current medications and will follow-up with his providers. ? ?Assessment and Plan: ?* Bilateral pulmonary embolism ([place]) ?Most likely had provoked PE secondary to recent left ankle fracture and immobilization.  Lower extremity venous Doppler with a suspicious opacity in right popliteal, left thigh veins are normal, unable to check calf veins due to presence of cast.  CTA with bilateral extensive PE with CT sign of right heart strain with RV/LV ratio equal to 1.9. ?Echocardiogram with severely dilated right ventricle and atrium, mildly reduced right ventricular systolic function with McConnell sign, normal EF. ?Vascular surgery was consulted and patient was placed on heparin infusion. ?Going for mechanical thrombectomy and thrombolysis with vascular surgery today ?-Continue with heparin infusion. ?-Continue with supportive care and supplemental oxygen as needed ? ?Elevated troponin ?Peaked at 316 and then started trending down.  Most likely secondary to demand ischemia with extensive PE and right heart strain. ? ?Ascending aortic aneurysm ([place]) ?Patient has significant ascending aortic aneurysm, on CTA it was measuring 4.2 and on echocardiogram it is 4.8. ?Vascular surgery is on board. ?No sign of dissection. ?-Continue to monitor ? ?Hypertension ?Patient was hypotensive on admission, requiring multiple boluses. ?Currently blood pressure stable with midodrine. ?He was on Lotensin and metoprolol at home ?-Keep holding home antihypertensives ? ?Diabetes mellitus without complication ([place]) ?CBG within goal. ?Patient was on metformin, Actos, Amaryl, Tradjenta at home. ?-SSI -switched to with meals and at night from every 4 hourly ? ?Hyperlipidemia ?- Continue statin ? ?Closed left ankle fracture ?Secondary to fall, s/p ORIF at left ankle on 01/20/2022. ?-Outpatient  orthopedic follow-up ? ?Chronic kidney disease, stage 3a ([place]) ?Seems stable and around his baseline. ?-Monitor renal function ?-Avoid nephrotoxins ? ?Leukocytosis ? UA was concerning for UTI.  Urine cultures with more than 100,000 colonies of E. coli, pending susceptibility. ?-Continue ceftriaxone ? ? ?Consultants: Vascular surgery ?Procedures performed: Mechanical thrombectomy and thrombolysis with tPA ?Disposition: Home ?Diet recommendation:  ?Discharge Diet Orders (From admission, onward)  ? ?  Start     Ordered  ? 02/19/22 0000  Diet - low sodium heart healthy       ? 02/19/22 1443  ? ?  ?  ? ?  ? ?Cardiac and Carb modified diet ?DISCHARGE MEDICATION: ?Allergies as of 02/19/2022   ? ?   Reactions  ? Amoxicillin Rash  ? Other Rash  ? ?  ? ?  ?Medication List  ?  ? ?STOP taking these medications   ? ?aspirin EC 81 MG tablet ?  ?linagliptin 5 MG Tabs tablet ?Commonly known as: TRADJENTA ?  ? ?  ? ?TAKE these medications   ? ?albuterol 108 (90 Base) MCG/ACT inhaler ?Commonly known as: VENTOLIN HFA ?Inhale 2 puffs into the lungs in the morning, at noon, in the evening, and at bedtime. ?  ?atorvastatin 20 MG tablet ?Commonly known as: LIPITOR ?Take 20 mg by mouth every morning. ?  ?benazepril-hydrochlorthiazide 10-12.5 MG tablet ?Commonly known as: LOTENSIN HCT ?Take 1 tablet by mouth every morning. Hold until you see your PCP ?What changed: additional instructions ?  ?cephALEXin 500 MG capsule ?Commonly known as: KEFLEX ?Take 1 capsule (500 mg  total) by mouth every 12 (twelve) hours for 2 days. ?Start taking on: Feb 20, 2022 ?  ?Cholecalciferol 25 MCG (1000 UT) capsule ?Take 1,000 Units by mouth daily. ?  ?cyanocobalamin 1000 MCG tablet ?Take 1,000 mcg by mouth daily. ?  ?docusate sodium 100 MG capsule ?Commonly known as: COLACE ?Take 1 capsule (100 mg total) by mouth 2 (two) times daily. ?  ?fenofibrate 160 MG tablet ?Take 160 mg by mouth daily. ?  ?ferrous [id number] C-folic acid capsule ?Commonly known  as: TRINSICON / FOLTRIN ?Take 1 capsule by mouth 2 (two) times daily after a meal. ?  ?Fish Oil 1000 MG Caps ?Take 1 capsule by mouth daily. ?  ?glimepiride 4 MG tablet ?Commonly known as: AMARYL ?Take 4 mg by mo

## 2022-02-19 NOTE — Progress Notes (Signed)
ANTICOAGULATION CONSULT NOTE ? ?Pharmacy Consult for Xarelto ?Indication: pulmonary embolus ? ?Allergies  ?Allergen Reactions  ? Amoxicillin Rash  ? Other Rash  ? ? ?Patient Measurements: ?Height: 5\' 5"  (165.1 cm) ?Weight: 90.9 kg (200 lb 6.4 oz) ?IBW/kg (Calculated) : 61.5 ?Heparin Dosing Weight: 83.1 kg ? ?Vital Signs: ?Temp: 98.4 ?F (36.9 ?C) (05/09 0500) ?Temp Source: Oral (05/09 0500) ?BP: 121/72 (05/09 0500) ?Pulse Rate: 72 (05/09 0500) ? ?Labs: ?Recent Labs  ?  02/17/22 ?[id number] 02/17/22 ?0802 02/17/22 ?[id number] 02/17/22 ?1045 02/17/22 ?1609 02/17/22 ?1709 02/18/22 ?[id number] 02/19/22 ?[id number]  ?HGB 11.0*  --   --   --   --  10.8*  --  9.9*  ?HCT 34.4*  --   --   --   --  33.8*  --  30.8*  ?PLT 305  --   --   --   --  304  --  259  ?HEPARINUNFRC  --    < >  --   --  0.31  --  0.31 0.19*  ?CREATININE 1.54*  --   --   --   --   --   --  1.34*  ?TROPONINIHS  --   --  65* 64*  --   --   --   --   ? < > = values in this interval not displayed.  ? ? ? ?Estimated Creatinine Clearance: 52.4 mL/min (A) (by C-G formula based on SCr of 1.34 mg/dL (H)). ? ? ?Medical History: ?Past Medical History:  ?Diagnosis Date  ? Chronic kidney disease, stage 3a ([place])   ? History of asthma   ? NO ISSUES SINCE THE 1990'S  ? Hyperlipidemia   ? Hypertension   ? Left ureteral calculus   ? Type 2 diabetes mellitus ([place])   ? ? ?Assessment: ?Pt is 71 yo male presenting to ED c/o sudden onset of central chest pain, SOB, and diaphoresis. Pharmacy consulted to transition patient from heparin to Xarelto.  ? ?Goal of Therapy:  ?Monitor platelets by anticoagulation protocol: Yes ? ?  ?Plan:  ?Discontinue heparin and start Xarelto 15 mg BID x 21 days followed by 20 mg daily. CBC per protocol.  ? ? ?Sherilyn Banker, PharmD ?Clinical Pharmacist  ?02/19/2022 8:49 AM  ? ? ? ? ? ? ? ?

## 2022-02-19 NOTE — TOC CM/SW Note (Signed)
Patient has orders to discharge home today. Chart reviewed. PCP is Dr. Linthavong. On room air. No wounds. No TOC needs identified. CSW signing off. ? ?Craig Donaldson, CSW ?[phone] ? ?

## 2022-02-19 NOTE — Care Management Important Message (Signed)
Important Message ? ?Patient Details  ?Name: Craig Donaldson ?MRN: [id number] ?Date of Birth: 11-08-1950 ? ? ?Medicare Important Message Given:  Yes ? ? ? ? ?Dannette Barbara ?02/19/2022, 11:21 AM ?

## 2022-02-26 ENCOUNTER — Encounter (INDEPENDENT_AMBULATORY_CARE_PROVIDER_SITE_OTHER): Payer: Self-pay | Admitting: Nurse Practitioner

## 2022-02-26 ENCOUNTER — Ambulatory Visit (INDEPENDENT_AMBULATORY_CARE_PROVIDER_SITE_OTHER): Payer: Medicare PPO | Admitting: Nurse Practitioner

## 2022-02-26 VITALS — BP 123/79 | HR 76 | Resp 16

## 2022-02-26 DIAGNOSIS — I10 Essential (primary) hypertension: Secondary | ICD-10-CM | POA: Diagnosis not present

## 2022-02-26 DIAGNOSIS — Z8719 Personal history of other diseases of the digestive system: Secondary | ICD-10-CM | POA: Insufficient documentation

## 2022-02-26 DIAGNOSIS — E785 Hyperlipidemia, unspecified: Secondary | ICD-10-CM | POA: Diagnosis not present

## 2022-02-26 DIAGNOSIS — E6609 Other obesity due to excess calories: Secondary | ICD-10-CM | POA: Insufficient documentation

## 2022-02-26 DIAGNOSIS — E1169 Type 2 diabetes mellitus with other specified complication: Secondary | ICD-10-CM

## 2022-02-26 DIAGNOSIS — I2699 Other pulmonary embolism without acute cor pulmonale: Secondary | ICD-10-CM

## 2022-03-06 ENCOUNTER — Encounter (INDEPENDENT_AMBULATORY_CARE_PROVIDER_SITE_OTHER): Payer: Self-pay | Admitting: Nurse Practitioner

## 2022-03-06 NOTE — Progress Notes (Signed)
Subjective:    Patient ID: Craig Donaldson, male    DOB: 08-Aug-1951, 71 y.o.   MRN: [id number] Chief Complaint  Patient presents with   Follow-up    [facility] 2 week follow up    Craig Donaldson is a 71 y.o. male, who was admitted to [facility] emergency department with shortness of breath.  The patient had a ORIF of a left ankle fracture that he sustained from a fall.  He was found to have bilateral pulmonary embolism with right heart strain.  At that time he was symptomatic.  Following pulmonary thrombectomy the patient notes that his breathing is much improved.    Review of Systems  Musculoskeletal:  Positive for gait problem.  All other systems reviewed and are negative.     Objective:   Physical Exam Vitals reviewed.  HENT:     Head: Normocephalic.  Cardiovascular:     Rate and Rhythm: Normal rate.  Pulmonary:     Effort: Pulmonary effort is normal.  Skin:    General: Skin is warm and dry.  Neurological:     Mental Status: He is alert and oriented to person, place, and time.     Gait: Gait abnormal.  Psychiatric:        Mood and Affect: Mood normal.        Behavior: Behavior normal.        Thought Content: Thought content normal.        Judgment: Judgment normal.    BP 123/79 (BP Location: Right Arm)   Pulse 76   Resp 16   Past Medical History:  Diagnosis Date   Chronic kidney disease, stage 3a (HCC)    History of asthma    NO ISSUES SINCE THE 1990'S   Hyperlipidemia    Hypertension    Left ureteral calculus    Type 2 diabetes mellitus (HCC)     Social History   Socioeconomic History   Marital status: Married    Spouse name: Not on file   Number of children: Not on file   Years of education: Not on file   Highest education level: Not on file  Occupational History   Not on file  Tobacco Use   Smoking status: Never   Smokeless tobacco: Current    Types: Snuff   Tobacco comments:    74 YR TOBACCO USE  Substance and Sexual Activity   Alcohol use: No    Drug use: No   Sexual activity: Not on file  Other Topics Concern   Not on file  Social History Narrative   Not on file   Social Determinants of Health   Financial Resource Strain: Not on file  Food Insecurity: Not on file  Transportation Needs: Not on file  Physical Activity: Not on file  Stress: Not on file  Social Connections: Not on file  Intimate Partner Violence: Not on file    Past Surgical History:  Procedure Laterality Date   CYSTOSCOPY WITH RETROGRADE PYELOGRAM, URETEROSCOPY AND STENT PLACEMENT Left 06/04/2013   Procedure: CYSTOSCOPY WITH RETROGRADE PYELOGRAM, URETEROSCOPY AND STENT PLACEMENT;  Surgeon: Hanley Ben, MD;  Location: [place];  Service: Urology;  Laterality: Left;  1 HR    HOLMIUM LASER APPLICATION Left 0000000   Procedure: HOLMIUM LASER APPLICATION;  Surgeon: Hanley Ben, MD;  Location: [place];  Service: Urology;  Laterality: Left;   ORIF ANKLE FRACTURE Left 01/20/2022   Procedure: OPEN REDUCTION INTERNAL FIXATION (ORIF) ANKLE FRACTURE;  Surgeon: Thornton Park, MD;  Location: [facility];  Service: Orthopedics;  Laterality: Left;   PULMONARY THROMBECTOMY Bilateral 02/18/2022   Procedure: PULMONARY THROMBECTOMY;  Surgeon: Algernon Huxley, MD;  Location: [place] [facility];  Service: Cardiovascular;  Laterality: Bilateral;    Family History  Problem Relation Age of Onset   Lung cancer Father    Diabetes Brother     Allergies  Allergen Reactions   Amoxicillin Rash   Other Rash       Latest Ref Rng & Units 02/19/2022    6:52 AM 02/17/2022    5:09 PM 02/17/2022    5:20 AM  CBC  WBC 4.0 - 10.5 K/uL 9.0   11.0   11.3    Hemoglobin 13.0 - 17.0 g/dL 9.9   10.8   11.0    Hematocrit 39.0 - 52.0 % 30.8   33.8   34.4    Platelets 150 - 400 K/uL 259   304   305        CMP     Component Value Date/Time   NA 135 02/19/2022 0652   K 4.0 02/19/2022 0652   CL 108 02/19/2022 0652   CO2 22 02/19/2022 0652    GLUCOSE 177 (H) 02/19/2022 0652   BUN 18 02/19/2022 0652   CREATININE 1.34 (H) 02/19/2022 0652   CALCIUM 8.1 (L) 02/19/2022 0652   PROT 6.7 02/15/2022 2222   ALBUMIN 3.0 (L) 02/15/2022 2222   AST 17 02/15/2022 2222   ALT 16 02/15/2022 2222   ALKPHOS 29 (L) 02/15/2022 2222   BILITOT 0.8 02/15/2022 2222   GFRNONAA 57 (L) 02/19/2022 0652   GFRAA >60 06/08/2019 1819     No results found.     Assessment & Plan:   1. Bilateral pulmonary embolism [facility]) Patient is doing much better post thrombectomy.  The patient's pulmonary embolism was very likely a provoked embolism given his fracture.  We discussed appropriate length of anticoagulation.  Given that this was felt to be a provoked pulmonary embolism there is not a previous history of DVT or PE in his past, the patient is able to return at his baseline status before his ankle fracture in 3 months it may be reasonable to consider anticoagulation cessation.  However the patient is undergoing physical therapy and is not at his full capacity, continuing anticoagulation for 6 months is a more reasonable option.  The patient will return in approximately 8 weeks for bilateral DVT evaluation.  There is a suspected right peroneal DVT at previous ultrasound.  The left was not fully evaluated due to the presence of a cast.  We will evaluate if this has progressed to a chronic state.  2. Essential hypertension Continue antihypertensive medications as already ordered, these medications have been reviewed and there are no changes at this time.   3. Type 2 diabetes mellitus with hyperlipidemia ([place]) Continue hypoglycemic medications as already ordered, these medications have been reviewed and there are no changes at this time.  Hgb A1C to be monitored as already arranged by primary service    Current Outpatient Medications on File Prior to Visit  Medication Sig Dispense Refill   albuterol (VENTOLIN HFA) 108 (90 Base) MCG/ACT inhaler Inhale 2 puffs into  the lungs in the morning, at noon, in the evening, and at bedtime.     atorvastatin (LIPITOR) 20 MG tablet Take 20 mg by mouth every morning.     benazepril-hydrochlorthiazide (LOTENSIN HCT) 10-12.5 MG tablet Take 1 tablet by mouth  every morning. Hold until you see your PCP     Cholecalciferol 25 MCG (1000 UT) capsule Take 1,000 Units by mouth daily.     cyanocobalamin 1000 MCG tablet Take 1,000 mcg by mouth daily.     fenofibrate 160 MG tablet Take 160 mg by mouth daily.     ferrous [id number] C-folic acid (TRINSICON / FOLTRIN) capsule Take 1 capsule by mouth 2 (two) times daily after a meal. 60 capsule 2   glimepiride (AMARYL) 4 MG tablet Take 4 mg by mouth 2 (two) times daily.     magnesium oxide (MAG-OX) 400 MG tablet Take 400 mg by mouth daily.     metFORMIN (GLUCOPHAGE) 1000 MG tablet Take 1,000 mg by mouth 2 (two) times daily with a meal.     metoprolol succinate (TOPROL-XL) 50 MG 24 hr tablet Take 25 mg by mouth every morning. Take with or immediately following a meal.     Omega-3 Fatty Acids (FISH OIL) 1000 MG CAPS Take 1 capsule by mouth daily.     pioglitazone (ACTOS) 15 MG tablet Take 15 mg by mouth daily.     Potassium Citrate,Elemental K, 99 MG CAPS Take 1 capsule by mouth daily.     rivaroxaban (XARELTO) 20 MG TABS tablet Take 1 tablet (20 mg total) by mouth daily with supper. Dispense after completion of starter pack 30 tablet 2   RIVAROXABAN (XARELTO) VTE STARTER PACK (15 & 20 MG) Follow package directions: Take one 15mg  tablet by mouth twice a day. On day 22, switch to one 20mg  tablet once a day. Take with food. 51 each 0   senna (SENOKOT) 8.6 MG tablet Take 1 tablet by mouth daily.     TOUJEO MAX SOLOSTAR 300 UNIT/ML Solostar Pen Inject 66 Units into the skin daily.     docusate sodium (COLACE) 100 MG capsule Take 1 capsule (100 mg total) by mouth 2 (two) times daily. (Patient not taking: Reported on 02/16/2022) 10 capsule 0   No current facility-administered  medications on file prior to visit.    There are no Patient Instructions on file for this visit. No follow-ups on file.   Kris Hartmann, NP

## 2022-04-22 ENCOUNTER — Other Ambulatory Visit (INDEPENDENT_AMBULATORY_CARE_PROVIDER_SITE_OTHER): Payer: Self-pay | Admitting: Vascular Surgery

## 2022-04-22 DIAGNOSIS — I2699 Other pulmonary embolism without acute cor pulmonale: Secondary | ICD-10-CM

## 2022-04-23 ENCOUNTER — Ambulatory Visit (INDEPENDENT_AMBULATORY_CARE_PROVIDER_SITE_OTHER): Payer: Medicare PPO | Admitting: Nurse Practitioner

## 2022-04-23 ENCOUNTER — Encounter (INDEPENDENT_AMBULATORY_CARE_PROVIDER_SITE_OTHER): Payer: Self-pay | Admitting: Vascular Surgery

## 2022-04-23 ENCOUNTER — Ambulatory Visit (INDEPENDENT_AMBULATORY_CARE_PROVIDER_SITE_OTHER): Payer: Medicare PPO

## 2022-04-23 ENCOUNTER — Ambulatory Visit (INDEPENDENT_AMBULATORY_CARE_PROVIDER_SITE_OTHER): Payer: Medicare PPO | Admitting: Vascular Surgery

## 2022-04-23 VITALS — BP 124/77 | HR 83 | Resp 16 | Ht 65.0 in | Wt 209.0 lb

## 2022-04-23 DIAGNOSIS — I2699 Other pulmonary embolism without acute cor pulmonale: Secondary | ICD-10-CM

## 2022-04-23 DIAGNOSIS — E1169 Type 2 diabetes mellitus with other specified complication: Secondary | ICD-10-CM | POA: Diagnosis not present

## 2022-04-23 DIAGNOSIS — E782 Mixed hyperlipidemia: Secondary | ICD-10-CM

## 2022-04-23 DIAGNOSIS — I10 Essential (primary) hypertension: Secondary | ICD-10-CM

## 2022-04-23 DIAGNOSIS — I7121 Aneurysm of the ascending aorta, without rupture: Secondary | ICD-10-CM

## 2022-04-23 DIAGNOSIS — E785 Hyperlipidemia, unspecified: Secondary | ICD-10-CM

## 2022-04-23 NOTE — Assessment & Plan Note (Signed)
blood glucose control important in reducing the progression of atherosclerotic disease. Also, involved in wound healing. On appropriate medications.  

## 2022-04-23 NOTE — Assessment & Plan Note (Signed)
lipid control important in reducing the progression of atherosclerotic disease. Continue statin therapy  

## 2022-04-23 NOTE — Progress Notes (Signed)
MRN : [id number]  Craig Donaldson is a 71 y.o. (1951-05-11) male who presents with chief complaint of No chief complaint on file. 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  He is doing well today.  He feels like he is at his baseline in terms of breathing.  He is not having any leg pain or swelling.  He has been on Xarelto 20 mg daily for the last couple of months and has tolerated this well without any bleeding issues.  He underwent pulmonary thrombectomy about 2 months ago.  Duplex today shows no residual DVT in either lower extremity. The patient has no symptoms referable to his ascending thoracic aortic aneurysm.  No peripheral embolization, chest pain, or back pain. We discussed and reviewed his CT scan today.  He has a 4.2 cm ascending thoracic aortic aneurysm seen a couple of months ago on his CT scan.  Current Outpatient Medications  Medication Sig Dispense Refill   albuterol (VENTOLIN HFA) 108 (90 Base) MCG/ACT inhaler Inhale 2 puffs into the lungs in the morning, at noon, in the evening, and at bedtime.     atorvastatin (LIPITOR) 20 MG tablet Take 20 mg by mouth every morning.     benazepril-hydrochlorthiazide (LOTENSIN HCT) 10-12.5 MG tablet Take 1 tablet by mouth every morning. Hold until you see your PCP     Cholecalciferol 25 MCG (1000 UT) capsule Take 1,000 Units by mouth daily.     cyanocobalamin 1000 MCG tablet Take 1,000 mcg by mouth daily.     fenofibrate 160 MG tablet Take 160 mg by mouth daily.     ferrous [id number] C-folic acid (TRINSICON / FOLTRIN) capsule Take 1 capsule by mouth 2 (two) times daily after a meal. 60 capsule 2   glimepiride (AMARYL) 4 MG tablet Take 4 mg by mouth 2 (two) times daily.     magnesium oxide (MAG-OX) 400 MG tablet Take 400 mg by mouth daily.     metFORMIN (GLUCOPHAGE) 1000 MG tablet Take 1,000 mg by mouth 2 (two) times daily with a meal.      metoprolol succinate (TOPROL-XL) 50 MG 24 hr tablet Take 25 mg by mouth every morning. Take with or immediately following a meal.     Omega-3 Fatty Acids (FISH OIL) 1000 MG CAPS Take 1 capsule by mouth daily.     pioglitazone (ACTOS) 15 MG tablet Take 15 mg by mouth daily.     Potassium Citrate,Elemental K, 99 MG CAPS Take 1 capsule by mouth daily.     rivaroxaban (XARELTO) 20 MG TABS tablet Take 1 tablet (20 mg total) by mouth daily with supper. Dispense after completion of starter pack 30 tablet 2   senna (SENOKOT) 8.6 MG tablet Take 1 tablet by mouth daily.     TOUJEO MAX SOLOSTAR 300 UNIT/ML Solostar Pen Inject 66 Units into the skin daily.     docusate sodium (COLACE) 100 MG capsule Take 1 capsule (100 mg total) by mouth 2 (two) times daily. (Patient not taking: Reported on 04/23/2022) 10 capsule 0   RIVAROXABAN (XARELTO) VTE STARTER PACK (15 & 20 MG) Follow package directions: Take one 15mg  tablet by mouth twice a day. On day 22, switch to one 20mg  tablet once a day. Take with food. (Patient not taking: Reported on 04/23/2022) 51 each 0   No current facility-administered medications for this visit.    Past Medical History:  Diagnosis Date   Chronic kidney disease, stage 3a ([place])    History of asthma    NO ISSUES SINCE THE 1990'S   Hyperlipidemia    Hypertension    Left ureteral calculus    Type 2 diabetes mellitus ([place])     Past Surgical History:  Procedure Laterality Date   CYSTOSCOPY WITH RETROGRADE PYELOGRAM, URETEROSCOPY AND STENT PLACEMENT Left 06/04/2013   Procedure: CYSTOSCOPY WITH RETROGRADE PYELOGRAM, URETEROSCOPY AND STENT PLACEMENT;  Surgeon: Hanley Ben, MD;  Location: [facility];  Service: Urology;  Laterality: Left;  1 HR    HOLMIUM LASER APPLICATION Left 0000000   Procedure: HOLMIUM LASER APPLICATION;  Surgeon: Hanley Ben, MD;  Location: [place];  Service: Urology;  Laterality: Left;   ORIF ANKLE FRACTURE Left 01/20/2022    Procedure: OPEN REDUCTION INTERNAL FIXATION (ORIF) ANKLE FRACTURE;  Surgeon: Thornton Park, MD;  Location: [facility];  Service: Orthopedics;  Laterality: Left;   PULMONARY THROMBECTOMY Bilateral 02/18/2022   Procedure: PULMONARY THROMBECTOMY;  Surgeon: Algernon Huxley, MD;  Location: [facility];  Service: Cardiovascular;  Laterality: Bilateral;     Social History   Tobacco Use   Smoking status: Never   Smokeless tobacco: Current    Types: Snuff   Tobacco comments:    34 YR TOBACCO USE  Substance Use Topics   Alcohol use: No   Drug use: No       Family History  Problem Relation Age of Onset   Lung cancer Father    Diabetes Brother   No bleeding or clotting disorders  Allergies  Allergen Reactions   Amoxicillin Rash   Other Rash     REVIEW OF SYSTEMS (Negative unless checked)  Constitutional: [] Weight loss  [] Fever  [] Chills Cardiac: [] Chest pain   [] Chest pressure   [] Palpitations   [] Shortness of breath when laying flat   [] Shortness of breath at rest   [x] Shortness of breath with exertion. Vascular:  [] Pain in legs with walking   [] Pain in legs at rest   [] Pain in legs when laying flat   [] Claudication   [] Pain in feet when walking  [] Pain in feet at rest  [] Pain in feet when laying flat   [x] History of DVT   [x] Phlebitis   [] Swelling in legs   [] Varicose veins   [] Non-healing ulcers Pulmonary:   [] Uses home oxygen   [] Productive cough   [] Hemoptysis   [] Wheeze  [] COPD   [] Asthma Neurologic:  [] Dizziness  [] Blackouts   [] Seizures   [] History of stroke   [] History of TIA  [] Aphasia   [] Temporary blindness   [] Dysphagia   [] Weakness or numbness in arms   [] Weakness or numbness in legs Musculoskeletal:  [x] Arthritis   [] Joint swelling   [x] Joint pain   [] Low back pain Hematologic:  [] Easy bruising  [] Easy bleeding   [] Hypercoagulable state   [] Anemic   Gastrointestinal:  [] Blood in stool   [] Vomiting blood  [] Gastroesophageal reflux/heartburn   [] Abdominal  pain Genitourinary:  [] Chronic kidney disease   [] Difficult urination  [] Frequent urination  [] Burning with urination   [] Hematuria Skin:  [] Rashes   [] Ulcers   [] Wounds Psychological:  [] History of anxiety   []  History of major depression.  Physical Examination  BP 124/77 (BP Location: Left Arm)   Pulse 83   Resp 16   Ht 5\' 5"  (1.651 m)   Wt 209 lb (94.8 kg)   BMI 34.78 kg/m  Gen:  WD/WN, NAD Head: [place]/AT, No temporalis wasting.  Ear/Nose/Throat: Hearing grossly intact, nares w/o erythema or drainage Eyes: Conjunctiva clear. Sclera non-icteric Neck: Supple.  Trachea midline Pulmonary:  Good air movement, no use of accessory muscles.  Cardiac: RRR, no JVD Vascular:  Vessel Right Left  Radial Palpable Palpable                   Musculoskeletal: M/S 5/5 throughout.  No deformity or atrophy.  No edema. Neurologic: Sensation grossly intact in extremities.  Symmetrical.  Speech is fluent.  Psychiatric: Judgment intact, Mood & affect appropriate for pt's clinical situation. Dermatologic: No rashes or ulcers noted.  No cellulitis or open wounds.      Labs Recent Results (from the past 2160 hour(s))  APTT     Status: Abnormal   Collection Time: 02/15/22 12:02 AM  Result Value Ref Range   aPTT >200 (HH) 24 - 36 seconds    Comment: CRITICAL RESULT CALLED TO, READ BACK BY AND VERIFIED WITH: AUSTIN REED @151  02/16/2022 TTG Performed at [facility], [address] [place], [place] [address]   Protime-INR     Status: Abnormal   Collection Time: 02/15/22 12:02 AM  Result Value Ref Range   Prothrombin Time 16.0 (H) 11.4 - 15.2 seconds   INR 1.3 (H) 0.8 - 1.2    Comment: (NOTE) INR goal varies based on device and disease states. Performed at [facility], [address] [place], [place] [address]   CBC     Status: Abnormal   Collection Time: 02/15/22 10:22 PM  Result Value Ref Range   WBC 13.1 (H) 4.0 - 10.5 K/uL   RBC 3.94 (L) 4.22 - 5.81 MIL/uL    Hemoglobin 11.6 (L) 13.0 - 17.0 g/dL   HCT 37.3 (L) 39.0 - 52.0 %   MCV 94.7 80.0 - 100.0 fL   MCH 29.4 26.0 - 34.0 pg   MCHC 31.1 30.0 - 36.0 g/dL   RDW 15.2 11.5 - 15.5 %   Platelets 326 150 - 400 K/uL   nRBC 0.0 0.0 - 0.2 %    Comment: Performed at [facility], [address], [place], [place] [address]  Comprehensive metabolic panel     Status: Abnormal   Collection Time: 02/15/22 10:22 PM  Result Value Ref Range   Sodium 137 135 - 145 mmol/L   Potassium 4.1 3.5 - 5.1 mmol/L   Chloride 106 98 - 111 mmol/L   CO2 24 22 - 32 mmol/L   Glucose, Bld 179 (H) 70 - 99 mg/dL    Comment: Glucose reference range applies only to samples taken after fasting for at least 8 hours.   BUN 33 (H) 8 - 23 mg/dL   Creatinine, Ser 1.64 (H) 0.61 - 1.24 mg/dL   Calcium 8.6 (L) 8.9 - 10.3 mg/dL   Total Protein 6.7 6.5 - 8.1 g/dL   Albumin 3.0 (L) 3.5 - 5.0 g/dL   AST 17 15 - 41 U/L   ALT 16 0 - 44 U/L   Alkaline Phosphatase 29 (L) 38 - 126 U/L   Total Bilirubin 0.8 0.3 - 1.2 mg/dL   GFR, Estimated 44 (L) >60 mL/min    Comment: (NOTE) Calculated using the CKD-EPI Creatinine Equation (2021)    Anion gap 7 5 - 15    Comment: Performed at [facility], [address] [place], [place] [address]  Troponin I (High Sensitivity)     Status: Abnormal   Collection Time: 02/15/22 10:22 PM  Result Value Ref  Range   Troponin I (High Sensitivity) 223 (HH) <18 ng/L    Comment: CRITICAL RESULT CALLED TO, READ BACK BY AND VERIFIED WITH AUSTIN REEVES AT 2315 02/15/2022 DLB (NOTE) Elevated high sensitivity troponin I (hsTnI) values and significant  changes across serial measurements may suggest ACS but many other  chronic and acute conditions are known to elevate hsTnI results.  Refer to the "Links" section for chest pain algorithms and additional  guidance. Performed at [facility], [address] [place], [place] [address]   Brain natriuretic peptide     Status: Abnormal    Collection Time: 02/15/22 10:22 PM  Result Value Ref Range   B Natriuretic Peptide 258.8 (H) 0.0 - 100.0 pg/mL    Comment: Performed at [facility], [place], [place] [address]  Troponin I (High Sensitivity)     Status: Abnormal   Collection Time: 02/16/22 12:02 AM  Result Value Ref Range   Troponin I (High Sensitivity) 316 (HH) <18 ng/L    Comment: CRITICAL VALUE NOTED. VALUE IS CONSISTENT WITH PREVIOUSLY REPORTED/CALLED VALUE DLB (NOTE) Elevated high sensitivity troponin I (hsTnI) values and significant  changes across serial measurements may suggest ACS but many other  chronic and acute conditions are known to elevate hsTnI results.  Refer to the "Links" section for chest pain algorithms and additional  guidance. Performed at [facility], [address] [place], [place] [address]   Resp Panel by RT-PCR (Flu A&B, Covid) Nasopharyngeal Swab     Status: None   Collection Time: 02/16/22 12:03 AM   Specimen: Nasopharyngeal Swab; Nasopharyngeal(NP) swabs in vial transport medium  Result Value Ref Range   SARS Coronavirus 2 by RT PCR NEGATIVE NEGATIVE    Comment: (NOTE) SARS-CoV-2 target nucleic acids are NOT DETECTED.  The SARS-CoV-2 RNA is generally detectable in upper respiratory specimens during the acute phase of infection. The lowest concentration of SARS-CoV-2 viral copies this assay can detect is 138 copies/mL. A negative result does not preclude SARS-Cov-2 infection and should not be used as the sole basis for treatment or other patient management decisions. A negative result may occur with  improper specimen collection/handling, submission of specimen other than nasopharyngeal swab, presence of viral mutation(s) within the areas targeted by this assay, and inadequate number of viral copies(<138 copies/mL). A negative result must be combined with clinical observations, patient history, and epidemiological information. The expected result  is Negative.  Fact Sheet for Patients:  EntrepreneurPulse.com.au  Fact Sheet for Healthcare Providers:  IncredibleEmployment.be  This test is no t yet approved or cleared by the [place] FDA and  has been authorized for detection and/or diagnosis of SARS-CoV-2 by FDA under an Emergency Use Authorization (EUA). This EUA will remain  in effect (meaning this test can be used) for the duration of the COVID-19 declaration under Section 564(b)(1) of the Act, 21 U.S.C.section 360bbb-3(b)(1), unless the authorization is terminated  or revoked sooner.       Influenza A by PCR NEGATIVE NEGATIVE   Influenza B by PCR NEGATIVE NEGATIVE    Comment: (NOTE) The Xpert Xpress SARS-CoV-2/FLU/RSV plus assay is intended as an aid in the diagnosis of influenza from Nasopharyngeal swab specimens and should not be used as a sole basis for treatment. Nasal washings and aspirates are unacceptable for Xpert Xpress SARS-CoV-2/FLU/RSV testing.  Fact Sheet for Patients: EntrepreneurPulse.com.au  Fact Sheet for Healthcare Providers: IncredibleEmployment.be  This test is not yet approved or cleared by the [place] and  has been authorized for detection and/or diagnosis of SARS-CoV-2 by FDA under an Emergency Use Authorization (EUA). This EUA will remain in effect (meaning this test can be used) for the duration of the COVID-19 declaration under Section 564(b)(1) of the Act, 21 U.S.C. section 360bbb-3(b)(1), unless the authorization is terminated or revoked.  Performed at [facility], [address] [place], [place] [address]   CBC     Status: Abnormal   Collection Time: 02/16/22  5:04 AM  Result Value Ref Range   WBC 12.3 (H) 4.0 - 10.5 K/uL   RBC 3.85 (L) 4.22 - 5.81 MIL/uL   Hemoglobin 11.3 (L) 13.0 - 17.0 g/dL   HCT 35.5 (L) 39.0 - 52.0 %   MCV 92.2 80.0 - 100.0 fL   MCH 29.4 26.0 - 34.0 pg   MCHC  31.8 30.0 - 36.0 g/dL   RDW 15.2 11.5 - 15.5 %   Platelets 308 150 - 400 K/uL   nRBC 0.0 0.0 - 0.2 %    Comment: Performed at [facility], [address], [place], [place] [id number]  Basic metabolic panel     Status: Abnormal   Collection Time: 02/16/22  5:04 AM  Result Value Ref Range   Sodium 137 135 - 145 mmol/L   Potassium 3.9 3.5 - 5.1 mmol/L   Chloride 109 98 - 111 mmol/L   CO2 22 22 - 32 mmol/L   Glucose, Bld 120 (H) 70 - 99 mg/dL    Comment: Glucose reference range applies only to samples taken after fasting for at least 8 hours.   BUN 31 (H) 8 - 23 mg/dL   Creatinine, Ser 1.50 (H) 0.61 - 1.24 mg/dL   Calcium 8.3 (L) 8.9 - 10.3 mg/dL   GFR, Estimated 49 (L) >60 mL/min    Comment: (NOTE) Calculated using the CKD-EPI Creatinine Equation (2021)    Anion gap 6 5 - 15    Comment: Performed at [facility], [address] [place], [place] [address]  Magnesium     Status: None   Collection Time: 02/16/22  5:04 AM  Result Value Ref Range   Magnesium 2.0 1.7 - 2.4 mg/dL    Comment: Performed at [facility], [address] [place], [place] [address]  Phosphorus     Status: None   Collection Time: 02/16/22  5:04 AM  Result Value Ref Range   Phosphorus 3.6 2.5 - 4.6 mg/dL    Comment: Performed at [facility], [place], [place] [address]  Heparin level (unfractionated)     Status: Abnormal   Collection Time: 02/16/22  5:04 AM  Result Value Ref Range   Heparin Unfractionated 0.13 (L) 0.30 - 0.70 IU/mL    Comment: (NOTE) The clinical reportable range upper limit is being lowered to >1.10 to align with the FDA approved guidance for the current laboratory assay.  If heparin results are below expected values, and patient dosage has  been confirmed, suggest follow up testing of antithrombin III levels. Performed at [facility], [address] [place], [place] [address]   APTT     Status: Abnormal   Collection  Time: 02/16/22  5:04 AM  Result Value Ref Range   aPTT 56 (H) 24 - 36 seconds    Comment:        IF BASELINE aPTT IS ELEVATED, SUGGEST PATIENT RISK ASSESSMENT BE USED TO DETERMINE APPROPRIATE ANTICOAGULANT THERAPY. Performed at [facility], [address],  [place], [place] [address]   Hemoglobin A1c     Status: Abnormal   Collection Time: 02/16/22  5:04 AM  Result Value Ref Range   Hgb A1c MFr Bld 7.0 (H) 4.8 - 5.6 %    Comment: (NOTE) Pre diabetes:          5.7%-6.4%  Diabetes:              >6.4%  Glycemic control for   <7.0% adults with diabetes    Mean Plasma Glucose 154.2 mg/dL    Comment: Performed at [facility], [address] [place], [place] [address]  CBG monitoring, ED     Status: Abnormal   Collection Time: 02/16/22  5:10 AM  Result Value Ref Range   Glucose-Capillary 126 (H) 70 - 99 mg/dL    Comment: Glucose reference range applies only to samples taken after fasting for at least 8 hours.  CBG monitoring, ED     Status: Abnormal   Collection Time: 02/16/22  7:12 AM  Result Value Ref Range   Glucose-Capillary 111 (H) 70 - 99 mg/dL    Comment: Glucose reference range applies only to samples taken after fasting for at least 8 hours.  CBG monitoring, ED     Status: Abnormal   Collection Time: 02/16/22 12:33 PM  Result Value Ref Range   Glucose-Capillary 148 (H) 70 - 99 mg/dL    Comment: Glucose reference range applies only to samples taken after fasting for at least 8 hours.  Heparin level (unfractionated)     Status: Abnormal   Collection Time: 02/16/22  1:54 PM  Result Value Ref Range   Heparin Unfractionated 0.17 (L) 0.30 - 0.70 IU/mL    Comment: (NOTE) The clinical reportable range upper limit is being lowered to >1.10 to align with the FDA approved guidance for the current laboratory assay.  If heparin results are below expected values, and patient dosage has  been confirmed, suggest follow up testing of antithrombin III  levels. Performed at [facility], [address] [place], [place] [address]   CBG monitoring, ED     Status: Abnormal   Collection Time: 02/16/22  4:28 PM  Result Value Ref Range   Glucose-Capillary 157 (H) 70 - 99 mg/dL    Comment: Glucose reference range applies only to samples taken after fasting for at least 8 hours.  CBG monitoring, ED     Status: Abnormal   Collection Time: 02/16/22  8:26 PM  Result Value Ref Range   Glucose-Capillary 123 (H) 70 - 99 mg/dL    Comment: Glucose reference range applies only to samples taken after fasting for at least 8 hours.  Urinalysis, Complete w Microscopic Urine, Clean Catch     Status: Abnormal   Collection Time: 02/16/22 11:05 PM  Result Value Ref Range   Color, Urine YELLOW (A) YELLOW   APPearance CLOUDY (A) CLEAR   Specific Gravity, Urine 1.019 1.005 - 1.030   pH 5.0 5.0 - 8.0   Glucose, UA NEGATIVE NEGATIVE mg/dL   Hgb urine dipstick MODERATE (A) NEGATIVE   Bilirubin Urine NEGATIVE NEGATIVE   Ketones, ur NEGATIVE NEGATIVE mg/dL   Protein, ur 30 (A) NEGATIVE mg/dL   Nitrite NEGATIVE NEGATIVE   Leukocytes,Ua LARGE (A) NEGATIVE   RBC / HPF >50 (H) 0 - 5 RBC/hpf   WBC, UA >50 (H) 0 - 5 WBC/hpf   Bacteria, UA MANY (A) NONE SEEN   Squamous Epithelial / LPF 0-5 0 - 5  WBC Clumps PRESENT    Non Squamous Epithelial PRESENT (A) NONE SEEN    Comment: Performed at [facility], [address] [place], [place] [address]  Urine Culture     Status: Abnormal   Collection Time: 02/16/22 11:05 PM   Specimen: Urine, Clean Catch  Result Value Ref Range   Specimen Description      URINE, CLEAN CATCH Performed at [facility], [address] [place], [place] [address]    Special Requests      NONE Performed at [facility], [place], [place] [address]    Culture >=100,000 COLONIES/mL ESCHERICHIA COLI (A)    Report Status 02/19/2022 FINAL    Organism ID, Bacteria ESCHERICHIA COLI (A)        Susceptibility   Escherichia coli - MIC*    AMPICILLIN <=2 SENSITIVE Sensitive     CEFAZOLIN <=4 SENSITIVE Sensitive     CEFEPIME <=0.12 SENSITIVE Sensitive     CEFTRIAXONE <=0.25 SENSITIVE Sensitive     CIPROFLOXACIN <=0.25 SENSITIVE Sensitive     GENTAMICIN <=1 SENSITIVE Sensitive     IMIPENEM <=0.25 SENSITIVE Sensitive     NITROFURANTOIN <=16 SENSITIVE Sensitive     TRIMETH/SULFA <=20 SENSITIVE Sensitive     AMPICILLIN/SULBACTAM <=2 SENSITIVE Sensitive     PIP/TAZO <=4 SENSITIVE Sensitive     * >=100,000 COLONIES/mL ESCHERICHIA COLI  Heparin level (unfractionated)     Status: Abnormal   Collection Time: 02/16/22 11:16 PM  Result Value Ref Range   Heparin Unfractionated 0.22 (L) 0.30 - 0.70 IU/mL    Comment: (NOTE) The clinical reportable range upper limit is being lowered to >1.10 to align with the FDA approved guidance for the current laboratory assay.  If heparin results are below expected values, and patient dosage has  been confirmed, suggest follow up testing of antithrombin III levels. Performed at [facility], [address] [place], [place] [address]   Glucose, capillary     Status: Abnormal   Collection Time: 02/16/22 11:21 PM  Result Value Ref Range   Glucose-Capillary 110 (H) 70 - 99 mg/dL    Comment: Glucose reference range applies only to samples taken after fasting for at least 8 hours.  Glucose, capillary     Status: Abnormal   Collection Time: 02/17/22  5:11 AM  Result Value Ref Range   Glucose-Capillary 122 (H) 70 - 99 mg/dL    Comment: Glucose reference range applies only to samples taken after fasting for at least 8 hours.  Basic metabolic panel     Status: Abnormal   Collection Time: 02/17/22  5:20 AM  Result Value Ref Range   Sodium 138 135 - 145 mmol/L   Potassium 3.6 3.5 - 5.1 mmol/L   Chloride 108 98 - 111 mmol/L   CO2 22 22 - 32 mmol/L   Glucose, Bld 119 (H) 70 - 99 mg/dL    Comment: Glucose reference range applies only to  samples taken after fasting for at least 8 hours.   BUN 26 (H) 8 - 23 mg/dL   Creatinine, Ser 1.54 (H) 0.61 - 1.24 mg/dL   Calcium 8.0 (L) 8.9 - 10.3 mg/dL   GFR, Estimated 48 (L) >60 mL/min    Comment: (NOTE) Calculated using the CKD-EPI Creatinine Equation (2021)    Anion gap 8 5 - 15    Comment: Performed at [facility], [address] [place], [place] [address]  Magnesium     Status: None  Collection Time: 02/17/22  5:20 AM  Result Value Ref Range   Magnesium 2.2 1.7 - 2.4 mg/dL    Comment: Performed at [facility], [address] [place], [place] [address]  Phosphorus     Status: None   Collection Time: 02/17/22  5:20 AM  Result Value Ref Range   Phosphorus 3.1 2.5 - 4.6 mg/dL    Comment: Performed at [facility], [address] [place], [place] [address]  CBC     Status: Abnormal   Collection Time: 02/17/22  5:20 AM  Result Value Ref Range   WBC 11.3 (H) 4.0 - 10.5 K/uL   RBC 3.69 (L) 4.22 - 5.81 MIL/uL   Hemoglobin 11.0 (L) 13.0 - 17.0 g/dL   HCT 08.1 (L) 44.8 - 18.5 %   MCV 93.2 80.0 - 100.0 fL   MCH 29.8 26.0 - 34.0 pg   MCHC 32.0 30.0 - 36.0 g/dL   RDW 63.1 49.7 - 02.6 %   Platelets 305 150 - 400 K/uL   nRBC 0.0 0.0 - 0.2 %    Comment: Performed at [facility], [address] [place], [place] [address]  Glucose, capillary     Status: None   Collection Time: 02/17/22  7:56 AM  Result Value Ref Range   Glucose-Capillary 84 70 - 99 mg/dL    Comment: Glucose reference range applies only to samples taken after fasting for at least 8 hours.  Heparin level (unfractionated)     Status: None   Collection Time: 02/17/22  8:02 AM  Result Value Ref Range   Heparin Unfractionated 0.37 0.30 - 0.70 IU/mL    Comment: (NOTE) The clinical reportable range upper limit is being lowered to >1.10 to align with the FDA approved guidance for the current laboratory assay.  If heparin results are below expected values, and patient  dosage has  been confirmed, suggest follow up testing of antithrombin III levels. Performed at [facility], [address] [place], [place] [address]   Troponin I (High Sensitivity)     Status: Abnormal   Collection Time: 02/17/22  9:12 AM  Result Value Ref Range   Troponin I (High Sensitivity) 65 (H) <18 ng/L    Comment: (NOTE) Elevated high sensitivity troponin I (hsTnI) values and significant  changes across serial measurements may suggest ACS but many other  chronic and acute conditions are known to elevate hsTnI results.  Refer to the "Links" section for chest pain algorithms and additional  guidance. Performed at [facility], [address] [place], [place] [address]   Troponin I (High Sensitivity)     Status: Abnormal   Collection Time: 02/17/22 10:45 AM  Result Value Ref Range   Troponin I (High Sensitivity) 64 (H) <18 ng/L    Comment: (NOTE) Elevated high sensitivity troponin I (hsTnI) values and significant  changes across serial measurements may suggest ACS but many other  chronic and acute conditions are known to elevate hsTnI results.  Refer to the "Links" section for chest pain algorithms and additional  guidance. Performed at [facility], [address] [place], [place] [address]   Glucose, capillary     Status: Abnormal   Collection Time: 02/17/22 11:55 AM  Result Value Ref Range   Glucose-Capillary 127 (H) 70 - 99 mg/dL    Comment: Glucose reference range applies only to samples taken after fasting for at least 8 hours.  ECHOCARDIOGRAM COMPLETE     Status: None  Collection Time: 02/17/22 12:18 PM  Result Value Ref Range   Weight 3,358.05 oz   Height 65 in   BP 121/82 mmHg   S' Lateral 3.20 cm   Area-P 1/2 2.87 cm2  Heparin level (unfractionated)     Status: None   Collection Time: 02/17/22  4:09 PM  Result Value Ref Range   Heparin Unfractionated 0.31 0.30 - 0.70 IU/mL    Comment: (NOTE) The clinical reportable  range upper limit is being lowered to >1.10 to align with the FDA approved guidance for the current laboratory assay.  If heparin results are below expected values, and patient dosage has  been confirmed, suggest follow up testing of antithrombin III levels. Performed at [facility], [address] [place], [place] [address]   Glucose, capillary     Status: Abnormal   Collection Time: 02/17/22  4:14 PM  Result Value Ref Range   Glucose-Capillary 100 (H) 70 - 99 mg/dL    Comment: Glucose reference range applies only to samples taken after fasting for at least 8 hours.  CBC     Status: Abnormal   Collection Time: 02/17/22  5:09 PM  Result Value Ref Range   WBC 11.0 (H) 4.0 - 10.5 K/uL   RBC 3.59 (L) 4.22 - 5.81 MIL/uL   Hemoglobin 10.8 (L) 13.0 - 17.0 g/dL   HCT 33.8 (L) 39.0 - 52.0 %   MCV 94.2 80.0 - 100.0 fL   MCH 30.1 26.0 - 34.0 pg   MCHC 32.0 30.0 - 36.0 g/dL   RDW 15.4 11.5 - 15.5 %   Platelets 304 150 - 400 K/uL   nRBC 0.0 0.0 - 0.2 %    Comment: Performed at [facility], [address] [place], [place] [address]  Glucose, capillary     Status: Abnormal   Collection Time: 02/17/22  9:06 PM  Result Value Ref Range   Glucose-Capillary 108 (H) 70 - 99 mg/dL    Comment: Glucose reference range applies only to samples taken after fasting for at least 8 hours.  Heparin level (unfractionated)     Status: None   Collection Time: 02/18/22  4:33 AM  Result Value Ref Range   Heparin Unfractionated 0.31 0.30 - 0.70 IU/mL    Comment: (NOTE) The clinical reportable range upper limit is being lowered to >1.10 to align with the FDA approved guidance for the current laboratory assay.  If heparin results are below expected values, and patient dosage has  been confirmed, suggest follow up testing of antithrombin III levels. Performed at [facility], [address] [place] [address] [place],  [address]   Glucose, capillary     Status: None   Collection Time:  02/18/22  8:36 AM  Result Value Ref Range   Glucose-Capillary 97 70 - 99 mg/dL    Comment: Glucose reference range applies only to samples taken after fasting for at least 8 hours.  Glucose, capillary     Status: None   Collection Time: 02/18/22 11:47 AM  Result Value Ref Range   Glucose-Capillary 86 70 - 99 mg/dL    Comment: Glucose reference range applies only to samples taken after fasting for at least 8 hours.  Glucose, capillary     Status: None   Collection Time: 02/18/22  3:07 PM  Result Value Ref Range   Glucose-Capillary 75 70 - 99 mg/dL    Comment: Glucose reference range applies only to samples taken after fasting for at least 8 hours.  Glucose,  capillary     Status: Abnormal   Collection Time: 02/18/22  4:20 PM  Result Value Ref Range   Glucose-Capillary 111 (H) 70 - 99 mg/dL    Comment: Glucose reference range applies only to samples taken after fasting for at least 8 hours.  Glucose, capillary     Status: Abnormal   Collection Time: 02/18/22  5:21 PM  Result Value Ref Range   Glucose-Capillary 127 (H) 70 - 99 mg/dL    Comment: Glucose reference range applies only to samples taken after fasting for at least 8 hours.  Glucose, capillary     Status: Abnormal   Collection Time: 02/18/22  8:35 PM  Result Value Ref Range   Glucose-Capillary 168 (H) 70 - 99 mg/dL    Comment: Glucose reference range applies only to samples taken after fasting for at least 8 hours.  Heparin level (unfractionated)     Status: Abnormal   Collection Time: 02/19/22  6:52 AM  Result Value Ref Range   Heparin Unfractionated 0.19 (L) 0.30 - 0.70 IU/mL    Comment: (NOTE) The clinical reportable range upper limit is being lowered to >1.10 to align with the FDA approved guidance for the current laboratory assay.  If heparin results are below expected values, and patient dosage has  been confirmed, suggest follow up testing of antithrombin III levels. Performed at [facility], [address] [place], [place] [address]   CBC     Status: Abnormal   Collection Time: 02/19/22  6:52 AM  Result Value Ref Range   WBC 9.0 4.0 - 10.5 K/uL   RBC 3.34 (L) 4.22 - 5.81 MIL/uL   Hemoglobin 9.9 (L) 13.0 - 17.0 g/dL   HCT 30.8 (L) 39.0 - 52.0 %   MCV 92.2 80.0 - 100.0 fL   MCH 29.6 26.0 - 34.0 pg   MCHC 32.1 30.0 - 36.0 g/dL   RDW 15.3 11.5 - 15.5 %   Platelets 259 150 - 400 K/uL   nRBC 0.0 0.0 - 0.2 %    Comment: Performed at [facility], [address], [place], [place] [id number]  Basic metabolic panel     Status: Abnormal   Collection Time: 02/19/22  6:52 AM  Result Value Ref Range   Sodium 135 135 - 145 mmol/L   Potassium 4.0 3.5 - 5.1 mmol/L   Chloride 108 98 - 111 mmol/L   CO2 22 22 - 32 mmol/L   Glucose, Bld 177 (H) 70 - 99 mg/dL    Comment: Glucose reference range applies only to samples taken after fasting for at least 8 hours.   BUN 18 8 - 23 mg/dL   Creatinine, Ser 1.34 (H) 0.61 - 1.24 mg/dL   Calcium 8.1 (L) 8.9 - 10.3 mg/dL   GFR, Estimated 57 (L) >60 mL/min    Comment: (NOTE) Calculated using the CKD-EPI Creatinine Equation (2021)    Anion gap 5 5 - 15    Comment: Performed at [facility], [address] [place], [place] [address]  Glucose, capillary     Status: Abnormal   Collection Time: 02/19/22  7:41 AM  Result Value Ref Range   Glucose-Capillary 149 (H) 70 - 99 mg/dL    Comment: Glucose reference range applies only to samples taken after fasting for at least 8 hours.  Glucose, capillary     Status: Abnormal   Collection Time: 02/19/22 12:43 PM  Result Value Ref Range   Glucose-Capillary 169 (H) 70 -  99 mg/dL    Comment: Glucose reference range applies only to samples taken after fasting for at least 8 hours.    Radiology No results found.  Assessment/Plan  Bilateral pulmonary embolism (HCC) Tolerating anticoagulation and doing well.  We will continue this for roughly 1 year.  I will see him back in 9 months to discuss  long-term therapy.  Duplex today shows no residual DVT in either lower extremity.  If he develops any pain or swelling in his legs, compression socks and elevation will be recommended.  Ascending aortic aneurysm [facility]) We discussed and reviewed his CT scan today.  He has a 4.2 cm ascending thoracic aortic aneurysm seen a couple of months ago on his CT scan.  This can be checked roughly annually with CT although it is unlikely to cause him any immediate problems at this size.  Blood pressure control is of paramount importance to keep this from growing.  We will plan a CT scan in 9 or 10 months.  Essential hypertension blood pressure control important in reducing the progression of atherosclerotic disease. On appropriate oral medications.   Type 2 diabetes mellitus with hyperlipidemia (HCC) blood glucose control important in reducing the progression of atherosclerotic disease. Also, involved in wound healing. On appropriate medications.   Mixed hyperlipidemia lipid control important in reducing the progression of atherosclerotic disease. Continue statin therapy    Leotis Pain, MD  04/23/2022 3:59 PM    This note was created with Dragon medical transcription system.  Any errors from dictation are purely unintentional

## 2022-04-23 NOTE — Assessment & Plan Note (Signed)
Tolerating anticoagulation and doing well.  We will continue this for roughly 1 year.  I will see him back in 9 months to discuss long-term therapy.  Duplex today shows no residual DVT in either lower extremity.  If he develops any pain or swelling in his legs, compression socks and elevation will be recommended.

## 2022-04-23 NOTE — Assessment & Plan Note (Signed)
We discussed and reviewed his CT scan today.  He has a 4.2 cm ascending thoracic aortic aneurysm seen a couple of months ago on his CT scan.  This can be checked roughly annually with CT although it is unlikely to cause him any immediate problems at this size.  Blood pressure control is of paramount importance to keep this from growing.  We will plan a CT scan in 9 or 10 months.

## 2022-04-23 NOTE — Assessment & Plan Note (Signed)
blood pressure control important in reducing the progression of atherosclerotic disease. On appropriate oral medications.  

## 2022-06-24 ENCOUNTER — Telehealth (INDEPENDENT_AMBULATORY_CARE_PROVIDER_SITE_OTHER): Payer: Self-pay

## 2022-06-24 ENCOUNTER — Other Ambulatory Visit (INDEPENDENT_AMBULATORY_CARE_PROVIDER_SITE_OTHER): Payer: Self-pay | Admitting: Nurse Practitioner

## 2022-06-24 MED ORDER — RIVAROXABAN 20 MG PO TABS
20.0000 mg | ORAL_TABLET | Freq: Every day | ORAL | 7 refills | Status: DC
Start: 2022-06-24 — End: 2023-01-27

## 2022-06-24 NOTE — Telephone Encounter (Signed)
PT's wife calls concerning the Xarelto, should pt continue this med or discontinue?  If pt should continue please send refill to [facility].  Please advise

## 2022-06-24 NOTE — Telephone Encounter (Signed)
Per the conversation he had with Dr. Wyn Quaker on 04/23/2022, he should continue for at least one year.I will send a refill

## 2022-06-24 NOTE — Telephone Encounter (Signed)
Pt's wife made aware and she states understanding

## 2022-09-24 ENCOUNTER — Encounter: Payer: Self-pay | Admitting: Ophthalmology

## 2022-09-24 NOTE — Discharge Instructions (Signed)

## 2022-09-26 ENCOUNTER — Ambulatory Visit: Payer: Medicare PPO | Admitting: Anesthesiology

## 2022-09-26 ENCOUNTER — Other Ambulatory Visit: Payer: Self-pay

## 2022-09-26 ENCOUNTER — Encounter
Admission: RE | Disposition: A | Discharge status: Home / Self Care | Payer: Self-pay | Source: Home / Self Care | Attending: Ophthalmology

## 2022-09-26 ENCOUNTER — Ambulatory Visit
Admission: RE | Admit: 2022-09-26 | Discharge: 2022-09-26 | Disposition: A | Discharge status: Home / Self Care | Payer: Medicare PPO | Attending: Ophthalmology | Admitting: Ophthalmology

## 2022-09-26 DIAGNOSIS — E1122 Type 2 diabetes mellitus with diabetic chronic kidney disease: Secondary | ICD-10-CM | POA: Diagnosis not present

## 2022-09-26 DIAGNOSIS — I129 Hypertensive chronic kidney disease with stage 1 through stage 4 chronic kidney disease, or unspecified chronic kidney disease: Secondary | ICD-10-CM | POA: Diagnosis not present

## 2022-09-26 DIAGNOSIS — N1831 Chronic kidney disease, stage 3a: Secondary | ICD-10-CM | POA: Insufficient documentation

## 2022-09-26 DIAGNOSIS — Z79899 Other long term (current) drug therapy: Secondary | ICD-10-CM | POA: Diagnosis not present

## 2022-09-26 DIAGNOSIS — E1169 Type 2 diabetes mellitus with other specified complication: Secondary | ICD-10-CM

## 2022-09-26 DIAGNOSIS — E1136 Type 2 diabetes mellitus with diabetic cataract: Secondary | ICD-10-CM | POA: Diagnosis present

## 2022-09-26 DIAGNOSIS — E785 Hyperlipidemia, unspecified: Secondary | ICD-10-CM | POA: Insufficient documentation

## 2022-09-26 DIAGNOSIS — J449 Chronic obstructive pulmonary disease, unspecified: Secondary | ICD-10-CM | POA: Diagnosis not present

## 2022-09-26 DIAGNOSIS — Z794 Long term (current) use of insulin: Secondary | ICD-10-CM | POA: Diagnosis not present

## 2022-09-26 DIAGNOSIS — H2512 Age-related nuclear cataract, left eye: Secondary | ICD-10-CM | POA: Diagnosis not present

## 2022-09-26 HISTORY — PX: CATARACT EXTRACTION W/PHACO: SHX586

## 2022-09-26 LAB — GLUCOSE, CAPILLARY
Glucose-Capillary: 30 mg/dL — CL (ref 70–99)
Glucose-Capillary: 149 mg/dL — ABNORMAL HIGH (ref 70–99)
Glucose-Capillary: 65 mg/dL — ABNORMAL LOW (ref 70–99)

## 2022-09-26 SURGERY — PHACOEMULSIFICATION, CATARACT, WITH IOL INSERTION
Anesthesia: Monitor Anesthesia Care | Site: Eye | Laterality: Left

## 2022-09-26 MED ORDER — SIGHTPATH DOSE#1 BSS IO SOLN
INTRAOCULAR | Status: DC | PRN
  Administered 2022-09-26: 69 mL via OPHTHALMIC

## 2022-09-26 MED ORDER — LIDOCAINE HCL (PF) 2 % IJ SOLN
INTRAOCULAR | Status: DC | PRN
  Administered 2022-09-26: 1 mL via INTRAOCULAR

## 2022-09-26 MED ORDER — MOXIFLOXACIN HCL 0.5 % OP SOLN
OPHTHALMIC | Status: DC | PRN
  Administered 2022-09-26: .2 mL via OPHTHALMIC

## 2022-09-26 MED ORDER — SIGHTPATH DOSE#1 BSS IO SOLN
INTRAOCULAR | Status: DC | PRN
  Administered 2022-09-26: 15 mL

## 2022-09-26 MED ORDER — BRIMONIDINE TARTRATE-TIMOLOL 0.2-0.5 % OP SOLN
OPHTHALMIC | Status: DC | PRN
  Administered 2022-09-26: 1 [drp] via OPHTHALMIC

## 2022-09-26 MED ORDER — MIDAZOLAM HCL 2 MG/2ML IJ SOLN
INTRAMUSCULAR | Status: DC | PRN
  Administered 2022-09-26: 1 mg via INTRAVENOUS

## 2022-09-26 MED ORDER — DEXTROSE 50 % IV SOLN
50.0000 mL | Freq: Once | INTRAVENOUS | Status: AC
  Administered 2022-09-26: 50 mL via INTRAVENOUS

## 2022-09-26 MED ORDER — ARMC OPHTHALMIC DILATING DROPS
1.0000 | OPHTHALMIC | Status: DC | PRN
  Administered 2022-09-26 (×3): 1 via OPHTHALMIC

## 2022-09-26 MED ORDER — FENTANYL CITRATE (PF) 100 MCG/2ML IJ SOLN
INTRAMUSCULAR | Status: DC | PRN
  Administered 2022-09-26: 50 ug via INTRAVENOUS

## 2022-09-26 MED ORDER — SIGHTPATH DOSE#1 NA HYALUR & NA CHOND-NA HYALUR IO KIT
PACK | INTRAOCULAR | Status: DC | PRN
  Administered 2022-09-26: 1 via OPHTHALMIC

## 2022-09-26 MED ORDER — DEXTROSE 50 % IV SOLN: 25.0000 mL | Freq: Once | INTRAVENOUS | Status: DC

## 2022-09-26 MED ORDER — TETRACAINE HCL 0.5 % OP SOLN
1.0000 [drp] | OPHTHALMIC | Status: DC | PRN
  Administered 2022-09-26 (×3): 1 [drp] via OPHTHALMIC

## 2022-09-26 MED ORDER — SODIUM CHLORIDE 0.9% FLUSH
INTRAVENOUS | Status: DC | PRN
  Administered 2022-09-26: 10 mL via INTRAVENOUS

## 2022-09-26 SURGICAL SUPPLY — 12 items
CATARACT SUITE SIGHTPATH (MISCELLANEOUS) ×1 IMPLANT
DISSECTOR HYDRO NUCLEUS 50X22 (MISCELLANEOUS) ×1 IMPLANT
DRSG TEGADERM 2-3/8X2-3/4 SM (GAUZE/BANDAGES/DRESSINGS) ×1 IMPLANT
FEE CATARACT SUITE SIGHTPATH (MISCELLANEOUS) ×1 IMPLANT
GLOVE SURG SYN 7.5  E (GLOVE) ×1
GLOVE SURG SYN 7.5 E (GLOVE) ×1 IMPLANT
GLOVE SURG SYN 7.5 PF PI (GLOVE) ×1 IMPLANT
GLOVE SURG SYN 8.5  E (GLOVE) ×1
GLOVE SURG SYN 8.5 E (GLOVE) ×1 IMPLANT
GLOVE SURG SYN 8.5 PF PI (GLOVE) ×1 IMPLANT
LENS IOL TECNIS EYHANCE 20.0 (Intraocular Lens) IMPLANT
WATER STERILE IRR 250ML POUR (IV SOLUTION) ×1 IMPLANT

## 2022-09-26 NOTE — Anesthesia Procedure Notes (Signed)
Procedure Name: MAC Date/Time: 09/26/2022 1:40 PM  Performed by: Candice Camp, CRNAPre-anesthesia Checklist: Timeout performed, Patient being monitored, Suction available, Emergency Drugs available and Patient identified Patient Re-evaluated:Patient Re-evaluated prior to induction Oxygen Delivery Method: Nasal cannula Preoxygenation: Pre-oxygenation with 100% oxygen Induction Type: IV induction

## 2022-09-26 NOTE — H&P (Signed)
[facility]   Primary Care Physician:  Marisue Ivan, MD Ophthalmologist: Dr. Deberah Pelton  Pre-Procedure History & Physical: HPI:  Craig Donaldson is a 71 y.o. male here for cataract surgery.   Past Medical History:  Diagnosis Date   Chronic kidney disease, stage 3a (HCC)    History of asthma    NO ISSUES SINCE THE 1990'S   Hyperlipidemia    Hypertension    Left ureteral calculus    Type 2 diabetes mellitus (HCC)     Past Surgical History:  Procedure Laterality Date   CYSTOSCOPY WITH RETROGRADE PYELOGRAM, URETEROSCOPY AND STENT PLACEMENT Left 06/04/2013   Procedure: CYSTOSCOPY WITH RETROGRADE PYELOGRAM, URETEROSCOPY AND STENT PLACEMENT;  Surgeon: Lindaann Slough, MD;  Location: [facility];  Service: Urology;  Laterality: Left;  1 HR    HOLMIUM LASER APPLICATION Left 06/04/2013   Procedure: HOLMIUM LASER APPLICATION;  Surgeon: Lindaann Slough, MD;  Location: [facility] ;  Service: Urology;  Laterality: Left;   ORIF ANKLE FRACTURE Left 01/20/2022   Procedure: OPEN REDUCTION INTERNAL FIXATION (ORIF) ANKLE FRACTURE;  Surgeon: Juanell Fairly, MD;  Location: [facility];  Service: Orthopedics;  Laterality: Left;   PULMONARY THROMBECTOMY Bilateral 02/18/2022   Procedure: PULMONARY THROMBECTOMY;  Surgeon: Annice Needy, MD;  Location: [facility];  Service: Cardiovascular;  Laterality: Bilateral;    Prior to Admission medications   Medication Sig Start Date End Date Taking? Authorizing Provider  albuterol (VENTOLIN HFA) 108 (90 Base) MCG/ACT inhaler Inhale 2 puffs into the lungs in the morning, at noon, in the evening, and at bedtime. 10/29/21  Yes [provider]  atorvastatin (LIPITOR) 20 MG tablet Take 20 mg by mouth every morning.   Yes [provider]  benazepril-hydrochlorthiazide (LOTENSIN HCT) 10-12.5 MG tablet Take 1 tablet by mouth every morning. Hold until you see your PCP 02/19/22  Yes Arnetha Courser, MD   Cholecalciferol 25 MCG (1000 UT) capsule Take 1,000 Units by mouth daily.   Yes [provider]  cyanocobalamin 1000 MCG tablet Take 1,000 mcg by mouth daily.   Yes [provider]  glimepiride (AMARYL) 4 MG tablet Take 4 mg by mouth 2 (two) times daily.   Yes [provider]  magnesium oxide (MAG-OX) 400 MG tablet Take 400 mg by mouth daily.   Yes [provider]  metFORMIN (GLUCOPHAGE) 1000 MG tablet Take 1,000 mg by mouth 2 (two) times daily with a meal.   Yes [provider]  metoprolol succinate (TOPROL-XL) 50 MG 24 hr tablet Take 25 mg by mouth every morning. Take with or immediately following a meal.   Yes [provider]  Omega-3 Fatty Acids (FISH OIL) 1000 MG CAPS Take 1 capsule by mouth daily.   Yes [provider]  pioglitazone (ACTOS) 15 MG tablet Take 15 mg by mouth daily.   Yes [provider]  Potassium Citrate,Elemental K, 99 MG CAPS Take 1 capsule by mouth daily.   Yes [provider]  rivaroxaban (XARELTO) 20 MG TABS tablet Take 1 tablet (20 mg total) by mouth daily with supper. Dispense after completion of starter pack 06/24/22  Yes Georgiana Spinner, NP  senna (SENOKOT) 8.6 MG tablet Take 1 tablet by mouth daily.   Yes [provider]  TOUJEO MAX SOLOSTAR 300 UNIT/ML Solostar Pen Inject 66 Units into the skin daily. 12/13/21  Yes [provider]  docusate sodium (COLACE) 100 MG capsule Take 1 capsule (100 mg total) by mouth  2 (two) times daily. Patient not taking: Reported on 04/23/2022 01/22/22   Lewie Chamber, MD  fenofibrate 160 MG tablet Take 160 mg by mouth daily. Patient not taking: Reported on 09/24/2022 12/09/21   [provider]  ferrous fumarate-b12-vitamic C-folic acid (TRINSICON / FOLTRIN) capsule Take 1 capsule by mouth 2 (two) times daily after a meal. Patient not taking: Reported on 09/24/2022 02/19/22   Arnetha Courser, MD    Allergies as of 08/22/2022 - Review  Complete 04/23/2022  Allergen Reaction Noted   Amoxicillin Rash 06/08/2019   Other Rash 03/25/2017    Family History  Problem Relation Age of Onset   Lung cancer Father    Diabetes Brother     Social History   Socioeconomic History   Marital status: Married    Spouse name: Not on file   Number of children: Not on file   Years of education: Not on file   Highest education level: Not on file  Occupational History   Not on file  Tobacco Use   Smoking status: Never   Smokeless tobacco: Current    Types: Snuff   Tobacco comments:    34 YR TOBACCO USE  Substance and Sexual Activity   Alcohol use: No   Drug use: No   Sexual activity: Not on file  Other Topics Concern   Not on file  Social History Narrative   Not on file   Social Determinants of Health   Financial Resource Strain: Not on file  Food Insecurity: Not on file  Transportation Needs: Not on file  Physical Activity: Not on file  Stress: Not on file  Social Connections: Not on file  Intimate Partner Violence: Not on file    Review of Systems: See HPI, otherwise negative ROS  Physical Exam: Ht 5\' 5"  (1.651 m)   Wt 93 kg   BMI 34.11 kg/m  General:   Alert, cooperative in NAD Head:  Normocephalic and atraumatic. Respiratory:  Normal work of breathing. Cardiovascular:  RRR  Impression/Plan: is here for cataract surgery.  Risks, benefits, limitations, and alternatives regarding cataract surgery have been reviewed with the patient.  Questions have been answered.  All parties agreeable.   Jeannie Fend, MD  09/26/2022, 11:29 AM

## 2022-09-26 NOTE — Transfer of Care (Signed)
Immediate Anesthesia Transfer of Care Note  Patient: Craig Donaldson  Procedure(s) Performed: CATARACT EXTRACTION PHACO AND INTRAOCULAR LENS PLACEMENT (IOC) LEFT  11.88  01:04.4 (Left: Eye)  Patient Location: PACU  Anesthesia Type: MAC  Level of Consciousness: awake, alert  and patient cooperative  Airway and Oxygen Therapy: Patient Spontanous Breathing and Patient connected to supplemental oxygen  Post-op Assessment: Post-op Vital signs reviewed, Patient's Cardiovascular Status Stable, Respiratory Function Stable, Patent Airway and No signs of Nausea or vomiting  Post-op Vital Signs: Reviewed and stable  Complications: FBS = 65; TO DRINK JUICE

## 2022-09-26 NOTE — Anesthesia Preprocedure Evaluation (Addendum)
Anesthesia Evaluation  Patient identified by MRN, date of birth, ID band Patient awake    Reviewed: Allergy & Precautions, H&P , NPO status , Patient's Chart, lab work & pertinent test results, reviewed documented beta blocker date and time   History of Anesthesia Complications Negative for: history of anesthetic complications  Airway Mallampati: II  TM Distance: >3 FB Neck ROM: Full    Dental  (+) Lower Dentures, Upper Dentures,    Pulmonary asthma , neg sleep apnea, neg COPD   breath sounds clear to auscultation       Cardiovascular hypertension, Pt. on home beta blockers and Pt. on medications (-) angina (-) Past MI and (-) Cardiac Stents (-) dysrhythmias  Rhythm:regular Rate:Normal     Neuro/Psych negative neurological ROS  negative psych ROS   GI/Hepatic negative GI ROS, Neg liver ROS,,,  Endo/Other  diabetes, Type 2, Insulin Dependent    Renal/GU Renal disease (CKD)     Musculoskeletal XR ankle IMPRESSION: Displaced fractures of the medial malleolus and distal fibula. Associated soft tissue swelling.  S/f ankle ORIF   Abdominal  (+) + obese  Peds  Hematology negative hematology ROS (+)   Anesthesia Other Findings Past Medical History: No date: Chronic kidney disease, stage 3a (HCC) No date: History of asthma     Comment:  NO ISSUES SINCE THE 1990'S No date: Hyperlipidemia No date: Hypertension No date: Left ureteral calculus No date: Type 2 diabetes mellitus (HCC)  Past Surgical History: 06/04/2013: CYSTOSCOPY WITH RETROGRADE PYELOGRAM, URETEROSCOPY AND  STENT PLACEMENT; Left     Comment:  Procedure: CYSTOSCOPY WITH RETROGRADE PYELOGRAM,               URETEROSCOPY AND STENT PLACEMENT;  Surgeon: Hanley Ben, MD;  Location: [place];                Service: Urology;  Laterality: Left;  1 HR  06/04/2013: HOLMIUM LASER APPLICATION; Left     Comment:  Procedure:  HOLMIUM LASER APPLICATION;  Surgeon:               Hanley Ben, MD;  Location: [place];  Service: Urology;  Laterality: Left;  BMI    Body Mass Index: 31.62 kg/m      Reproductive/Obstetrics negative OB ROS                             Anesthesia Physical Anesthesia Plan  ASA: 3  Anesthesia Plan: MAC   Post-op Pain Management: Minimal or no pain anticipated   Induction: Intravenous  PONV Risk Score and Plan: Midazolam  Airway Management Planned: Natural Airway  Additional Equipment:   Intra-op Plan:   Post-operative Plan:   Informed Consent: I have reviewed the patients History and Physical, chart, labs and discussed the procedure including the risks, benefits and alternatives for the proposed anesthesia with the patient or authorized representative who has indicated his/her understanding and acceptance.     Dental Advisory Given  Plan Discussed with: Anesthesiologist, CRNA and Surgeon  Anesthesia Plan Comments:         Anesthesia Quick Evaluation   Pre-op pop/saph blocks GETA Standard monitors NPO appropriate Risks/benefits discussed and pt agrees to proceed with anesthetic.  K Ramsdell

## 2022-09-26 NOTE — Op Note (Signed)
OPERATIVE NOTE  Craig Donaldson [id number] 09/26/2022   PREOPERATIVE DIAGNOSIS: Nuclear sclerotic cataract left eye. H25.12   POSTOPERATIVE DIAGNOSIS: Nuclear sclerotic cataract left eye. H25.12   PROCEDURE:  Phacoemusification with posterior chamber intraocular lens placement of the left eye  Ultrasound time: Procedure(s): CATARACT EXTRACTION PHACO AND INTRAOCULAR LENS PLACEMENT (IOC) LEFT  11.88  01:04.4 (Left)  LENS:   Implant Name Type Inv. Item Serial No. Manufacturer Lot No. LRB No. Used Action  LENS IOL TECNIS EYHANCE 20.0 - [id number] Intraocular Lens LENS IOL TECNIS EYHANCE 20.0 [id number] [employer]  Left 1 Implanted      SURGEON:  Julious Payer. Rolley Sims, MD   ANESTHESIA:  Topical with tetracaine drops, augmented with 1% preservative-free intracameral lidocaine.   COMPLICATIONS:  None.   DESCRIPTION OF PROCEDURE:  The patient was identified in the holding room and transported to the operating room and placed in the supine position under the operating microscope.  The left eye was identified as the operative eye, which was prepped and draped in the usual sterile ophthalmic fashion.   A 1 millimeter clear-corneal paracentesis was made inferotemporally. Preservative-free 1% lidocaine mixed with 1:1,000 bisulfite-free aqueous solution of epinephrine was injected into the anterior chamber. The anterior chamber was then filled with Viscoat viscoelastic. A 2.4 millimeter keratome was used to make a clear-corneal incision superotemporally. A curvilinear capsulorrhexis was made with a cystotome and capsulorrhexis forceps. Balanced salt solution was used to hydrodissect and hydrodelineate the nucleus. Phacoemulsification was then used to remove the lens nucleus and epinucleus. The remaining cortex was then removed using the irrigation and aspiration handpiece. Provisc was then placed into the capsular bag to distend it for lens placement. A +20.00 D DIB00 intraocular lens was then injected into  the capsular bag. The remaining viscoelastic was aspirated.   Wounds were hydrated with balanced salt solution.  The anterior chamber was inflated to a physiologic pressure with balanced salt solution.  No wound leaks were noted. Vigamox was injected intracamerally.  Timolol and Brimonidine drops were applied to the eye.  The patient was taken to the recovery room in stable condition without complications of anesthesia or surgery.  Craig Donaldson [name] 09/26/2022, 2:02 PM

## 2022-09-26 NOTE — Anesthesia Postprocedure Evaluation (Signed)
Anesthesia Post Note  Patient: Craig Donaldson  Procedure(s) Performed: CATARACT EXTRACTION PHACO AND INTRAOCULAR LENS PLACEMENT (IOC) LEFT  11.88  01:04.4 (Left: Eye)  Patient location during evaluation: PACU Anesthesia Type: MAC Level of consciousness: awake and alert Pain management: pain level controlled Vital Signs Assessment: post-procedure vital signs reviewed and stable Respiratory status: spontaneous breathing, nonlabored ventilation and respiratory function stable Cardiovascular status: blood pressure returned to baseline and stable Postop Assessment: no apparent nausea or vomiting Anesthetic complications: no Comments: BG 65. Pt drinking soda and feels normal. He states his BG is usually 60's in the morning. He will monitor his glucose at home.    No notable events documented.   Last Vitals:  Vitals:   09/26/22 1225 09/26/22 1405  BP: 124/76 118/74  Pulse: 80 73  Resp: 20 18  Temp: (!) 36.4 C (!) 36.1 C  SpO2: 97% 98%    Last Pain:  Vitals:   09/26/22 1405  TempSrc:   PainSc: 0-No pain                 [place] Ouch

## 2022-09-27 ENCOUNTER — Encounter: Payer: Self-pay | Admitting: Ophthalmology

## 2022-10-31 ENCOUNTER — Ambulatory Visit: Admit: 2022-10-31 | Payer: Medicare PPO | Admitting: Ophthalmology

## 2022-10-31 SURGERY — PHACOEMULSIFICATION, CATARACT, WITH IOL INSERTION
Anesthesia: Topical | Laterality: Right

## 2023-01-26 ENCOUNTER — Other Ambulatory Visit (INDEPENDENT_AMBULATORY_CARE_PROVIDER_SITE_OTHER): Payer: Self-pay | Admitting: Nurse Practitioner

## 2023-01-27 NOTE — Telephone Encounter (Signed)
Patient needs a follow-up appointment as soon as possible.

## 2023-02-23 ENCOUNTER — Other Ambulatory Visit (INDEPENDENT_AMBULATORY_CARE_PROVIDER_SITE_OTHER): Payer: Self-pay | Admitting: Nurse Practitioner

## 2023-02-28 ENCOUNTER — Ambulatory Visit
Admission: RE | Admit: 2023-02-28 | Discharge: 2023-02-28 | Disposition: A | Discharge status: Home / Self Care | Payer: Medicare PPO | Source: Ambulatory Visit | Attending: Vascular Surgery | Admitting: Vascular Surgery

## 2023-02-28 DIAGNOSIS — I7121 Aneurysm of the ascending aorta, without rupture: Secondary | ICD-10-CM | POA: Diagnosis present

## 2023-02-28 LAB — POCT I-STAT CREATININE: Creatinine, Ser: 1.8 mg/dL — ABNORMAL HIGH (ref 0.61–1.24)

## 2023-02-28 MED ORDER — IOHEXOL 350 MG/ML SOLN
75.0000 mL | Freq: Once | INTRAVENOUS | Status: AC | PRN
  Administered 2023-02-28: 75 mL via INTRAVENOUS

## 2023-03-04 ENCOUNTER — Ambulatory Visit (INDEPENDENT_AMBULATORY_CARE_PROVIDER_SITE_OTHER): Payer: Medicare PPO | Admitting: Vascular Surgery

## 2023-03-04 VITALS — BP 118/72 | HR 74 | Resp 17 | Ht 66.0 in | Wt 205.2 lb

## 2023-03-04 DIAGNOSIS — E1169 Type 2 diabetes mellitus with other specified complication: Secondary | ICD-10-CM | POA: Diagnosis not present

## 2023-03-04 DIAGNOSIS — I2699 Other pulmonary embolism without acute cor pulmonale: Secondary | ICD-10-CM

## 2023-03-04 DIAGNOSIS — I7121 Aneurysm of the ascending aorta, without rupture: Secondary | ICD-10-CM

## 2023-03-04 DIAGNOSIS — I10 Essential (primary) hypertension: Secondary | ICD-10-CM

## 2023-03-04 DIAGNOSIS — E785 Hyperlipidemia, unspecified: Secondary | ICD-10-CM

## 2023-03-04 NOTE — Progress Notes (Signed)
MRN : [id number]  Craig Donaldson is a 72 y.o. (1951-03-24) male who presents with chief complaint of  Chief Complaint  Patient presents with   Follow-up  .  History of Present Illness: Patient returns today in follow up of multiple vascular issues.  About a year ago, he underwent bilateral pulmonary thrombectomy for submassive PE.  He did quite well from this.  He has been on anticoagulation since that time and has tolerated it well without any issues.  No current chest pain or shortness of breath. He also has a small ascending thoracic aortic aneurysm that was detected at that time.  He had a recent CT scan which I have independently reviewed which shows this to be stable at 4.1 cm in maximal diameter.  Current Outpatient Medications  Medication Sig Dispense Refill   albuterol (VENTOLIN HFA) 108 (90 Base) MCG/ACT inhaler Inhale 2 puffs into the lungs in the morning, at noon, in the evening, and at bedtime.     atorvastatin (LIPITOR) 20 MG tablet Take 20 mg by mouth every morning.     benazepril-hydrochlorthiazide (LOTENSIN HCT) 10-12.5 MG tablet Take 1 tablet by mouth every morning. Hold until you see your PCP     Cholecalciferol 25 MCG (1000 UT) capsule Take 1,000 Units by mouth daily.     cyanocobalamin 1000 MCG tablet Take 1,000 mcg by mouth daily.     docusate sodium (COLACE) 100 MG capsule Take 1 capsule (100 mg total) by mouth 2 (two) times daily. 10 capsule 0   fenofibrate 160 MG tablet Take 160 mg by mouth daily.     ferrous fumarate-b12-vitamic C-folic acid (TRINSICON / FOLTRIN) capsule Take 1 capsule by mouth 2 (two) times daily after a meal. 60 capsule 2   glimepiride (AMARYL) 4 MG tablet Take 4 mg by mouth 2 (two) times daily.     magnesium oxide (MAG-OX) 400 MG tablet Take 400 mg by mouth daily.     metFORMIN (GLUCOPHAGE) 1000 MG tablet Take 1,000 mg by mouth 2 (two) times daily with a meal.     metoprolol succinate (TOPROL-XL) 50 MG 24 hr tablet Take 25 mg by mouth every  morning. Take with or immediately following a meal.     Omega-3 Fatty Acids (FISH OIL) 1000 MG CAPS Take 1 capsule by mouth daily.     pioglitazone (ACTOS) 15 MG tablet Take 15 mg by mouth daily.     Potassium Citrate,Elemental K, 99 MG CAPS Take 1 capsule by mouth daily.     senna (SENOKOT) 8.6 MG tablet Take 1 tablet by mouth daily.     TOUJEO MAX SOLOSTAR 300 UNIT/ML Solostar Pen Inject 36 Units into the skin daily.     XARELTO 20 MG TABS tablet TAKE 1 TABLET BY MOUTH ONCE DAILY WITH SUPPER 30 tablet 0   No current facility-administered medications for this visit.    Past Medical History:  Diagnosis Date   Chronic kidney disease, stage 3a (HCC)    History of asthma    NO ISSUES SINCE THE 1990'S   Hyperlipidemia    Hypertension    Left ureteral calculus    Type 2 diabetes mellitus [facility])     Past Surgical History:  Procedure Laterality Date   CATARACT EXTRACTION W/PHACO Left 09/26/2022   Procedure: CATARACT EXTRACTION PHACO AND INTRAOCULAR LENS PLACEMENT (IOC) LEFT  11.88  01:04.4;  Surgeon: Estanislado Pandy, MD;  Location: [facility];  Service: Ophthalmology;  Laterality: Left;   CYSTOSCOPY  WITH RETROGRADE PYELOGRAM, URETEROSCOPY AND STENT PLACEMENT Left 06/04/2013   Procedure: CYSTOSCOPY WITH RETROGRADE PYELOGRAM, URETEROSCOPY AND STENT PLACEMENT;  Surgeon: Lindaann Slough, MD;  Location: [facility] [place];  Service: Urology;  Laterality: Left;  1 HR    HOLMIUM LASER APPLICATION Left 06/04/2013   Procedure: HOLMIUM LASER APPLICATION;  Surgeon: Lindaann Slough, MD;  Location: [facility] [place];  Service: Urology;  Laterality: Left;   ORIF ANKLE FRACTURE Left 01/20/2022   Procedure: OPEN REDUCTION INTERNAL FIXATION (ORIF) ANKLE FRACTURE;  Surgeon: Juanell Fairly, MD;  Location: [facility];  Service: Orthopedics;  Laterality: Left;   PULMONARY THROMBECTOMY Bilateral 02/18/2022   Procedure: PULMONARY THROMBECTOMY;  Surgeon: Annice Needy, MD;  Location: [facility]  INVASIVE CV LAB;  Service: Cardiovascular;  Laterality: Bilateral;     Social History   Tobacco Use   Smoking status: Never   Smokeless tobacco: Current    Types: Snuff   Tobacco comments:    34 YR TOBACCO USE  Substance Use Topics   Alcohol use: No   Drug use: No       Family History  Problem Relation Age of Onset   Lung cancer Father    Diabetes Brother      Allergies  Allergen Reactions   Amoxicillin Rash   Other Rash     REVIEW OF SYSTEMS (Negative unless checked)  Constitutional: [] Weight loss  [] Fever  [] Chills Cardiac: [] Chest pain   [] Chest pressure   [] Palpitations   [] Shortness of breath when laying flat   [] Shortness of breath at rest   [] Shortness of breath with exertion. Vascular:  [] Pain in legs with walking   [] Pain in legs at rest   [] Pain in legs when laying flat   [] Claudication   [] Pain in feet when walking  [] Pain in feet at rest  [] Pain in feet when laying flat   [x] History of DVT   [] Phlebitis   [] Swelling in legs   [] Varicose veins   [] Non-healing ulcers Pulmonary:   [] Uses home oxygen   [] Productive cough   [] Hemoptysis   [] Wheeze  [] COPD   [] Asthma Neurologic:  [] Dizziness  [] Blackouts   [] Seizures   [] History of stroke   [] History of TIA  [] Aphasia   [] Temporary blindness   [] Dysphagia   [] Weakness or numbness in arms   [] Weakness or numbness in legs Musculoskeletal:  [x] Arthritis   [] Joint swelling   [x] Joint pain   [] Low back pain Hematologic:  [] Easy bruising  [] Easy bleeding   [] Hypercoagulable state   [] Anemic   Gastrointestinal:  [] Blood in stool   [] Vomiting blood  [] Gastroesophageal reflux/heartburn   [] Abdominal pain Genitourinary:  [x] Chronic kidney disease   [] Difficult urination  [] Frequent urination  [] Burning with urination   [] Hematuria Skin:  [] Rashes   [] Ulcers   [] Wounds Psychological:  [] History of anxiety   []  History of major depression.  Physical Examination  BP 118/72 (BP Location: Right Arm)   Pulse 74   Resp 17    Ht 5\' 6"  (1.676 m)   Wt 205 lb 3.2 oz (93.1 kg)   BMI 33.12 kg/m  Gen:  WD/WN, NAD Head: [place]/AT, No temporalis wasting. Ear/Nose/Throat: Hearing grossly intact, nares w/o erythema or drainage Eyes: Conjunctiva clear. Sclera non-icteric Neck: Supple.  Trachea midline Pulmonary:  Good air movement, no use of accessory muscles.  Cardiac: RRR, no JVD Vascular:  Vessel Right Left  Radial Palpable Palpable       Musculoskeletal: M/S 5/5 throughout.  No deformity or atrophy.  No  significant lower extremity edema. Neurologic: Sensation grossly intact in extremities.  Symmetrical.  Speech is fluent.  Psychiatric: Judgment intact, Mood & affect appropriate for pt's clinical situation. Dermatologic: No rashes or ulcers noted.  No cellulitis or open wounds.      Labs Recent Results (from the past 2160 hour(s))  I-STAT creatinine     Status: Abnormal   Collection Time: 02/28/23  1:27 PM  Result Value Ref Range   Creatinine, Ser 1.80 (H) 0.61 - 1.24 mg/dL    Radiology CT ANGIO CHEST AORTA W/CM & OR WO/CM  Result Date: 02/28/2023 CLINICAL DATA:  72 year old male with a history of aortic aneurysm EXAM: CT ANGIOGRAPHY CHEST WITH CONTRAST TECHNIQUE: Multidetector CT imaging of the chest was performed using the standard protocol during bolus administration of intravenous contrast. Multiplanar CT image reconstructions and MIPs were obtained to evaluate the vascular anatomy. RADIATION DOSE REDUCTION: This exam was performed according to the departmental dose-optimization program which includes automated exposure control, adjustment of the mA and/or kV according to patient size and/or use of iterative reconstruction technique. CONTRAST:  75mL OMNIPAQUE IOHEXOL 350 MG/ML SOLN COMPARISON:  02/15/2022 FINDINGS: Cardiovascular: Heart: No cardiomegaly. No pericardial fluid/thickening. Calcifications of the left anterior descending, circumflex, right coronary arteries. Aorta: No significant aortic valve  calcifications. Greatest estimated diameter of the aortic annulus 29 mm on the coronal reformatted images. Greatest estimated diameter of the sino-tubular junction, 29 mm on the coronal images. Greatest estimated diameter of the ascending aorta on the axial images, 41 mm. No significant atherosclerotic changes of the aorta. Branch vessels are patent with a 3 vessel arch. Cervical cerebral vessels patent at the base of the neck. No pedunculated plaque, ulcerated plaque, dissection, periaortic fluid. No wall thickening. Pulmonary arteries: Timing of the contrast bolus is not optimized for evaluation of pulmonary artery filling defects, however, since the prior CT of 02/15/2022 there has been resolution of acute pulmonary embolism. Unremarkable size of the main pulmonary artery. Mediastinum/Nodes: No mediastinal adenopathy. Unremarkable appearance of the thoracic esophagus. Unremarkable appearance of the thoracic inlet. Single subcentimeter right thyroid nodule requiring no further follow-up in a patient of this age. Lungs/Pleura: Central airways are clear. No pleural effusion. No confluent airspace disease. No pneumothorax. Pleural based scarring along the left upper lobe lateral pleura. This can be observed on future follow-up studies for the aortic aneurysm. Upper Abdomen: Diffusely decreased attenuation of the liver parenchyma compatible with steatosis. Fluid density simple cysts of the superior right kidney, requiring no further follow-up. Hiatal hernia. Atherosclerosis of the upper abdominal aorta. Musculoskeletal: Degenerative changes of the spine. Kyphotic curvature. No bony canal narrowing. No acute displaced fracture Review of the MIP images confirms the above findings. IMPRESSION: No acute CT finding. Unchanged size and configuration of ascending aorta, estimated 4.1 cm on the current CT. Recommend annual imaging followup by CTA or MRA. This recommendation follows 2010  ACCF/AHA/AATS/ACR/ASA/SCA/SCAI/SIR/STS/SVM Guidelines for the Diagnosis and Management of Patients with Thoracic Aortic Disease. Circulation. 2010; 121: Z610-R604. Aortic aneurysm NOS (ICD10-I71.9) Coronary artery disease and aortic atherosclerosis. Aortic Atherosclerosis (ICD10-I70.0). Signed, Yvone Neu. Miachel Roux, RPVI [facility] Electronically Signed   By: Gilmer Mor D.O.   On: 02/28/2023 16:38    Assessment/Plan  Ascending aortic aneurysm (HCC) He had a recent CT scan which I have independently reviewed which shows this to be stable at 4.1 cm in maximal diameter.  No role for intervention at this size.  Continue to follow annually at this point with  CT the scan.  Bilateral pulmonary embolism (HCC) Tolerating anticoagulation without any issues.  As such, we will continue his anticoagulation.  Essential hypertension blood pressure control important in reducing the progression of atherosclerotic disease. On appropriate oral medications.   Type 2 diabetes mellitus with hyperlipidemia (HCC) blood glucose control important in reducing the progression of atherosclerotic disease. Also, involved in wound healing. On appropriate medications.    [name], MD  03/04/2023 2:05 PM    This note was created with Dragon medical transcription system.  Any errors from dictation are purely unintentional

## 2023-03-04 NOTE — Assessment & Plan Note (Signed)
He had a recent CT scan which I have independently reviewed which shows this to be stable at 4.1 cm in maximal diameter.  No role for intervention at this size.  Continue to follow annually at this point with CT the scan.

## 2023-03-04 NOTE — Assessment & Plan Note (Signed)
Tolerating anticoagulation without any issues.  As such, we will continue his anticoagulation.

## 2023-03-04 NOTE — Assessment & Plan Note (Signed)
blood pressure control important in reducing the progression of atherosclerotic disease. On appropriate oral medications.  

## 2023-03-04 NOTE — Assessment & Plan Note (Signed)
blood glucose control important in reducing the progression of atherosclerotic disease. Also, involved in wound healing. On appropriate medications.  

## 2023-03-25 ENCOUNTER — Other Ambulatory Visit (INDEPENDENT_AMBULATORY_CARE_PROVIDER_SITE_OTHER): Payer: Self-pay | Admitting: Nurse Practitioner

## 2023-07-25 ENCOUNTER — Other Ambulatory Visit (INDEPENDENT_AMBULATORY_CARE_PROVIDER_SITE_OTHER): Payer: Self-pay

## 2023-07-25 ENCOUNTER — Telehealth (INDEPENDENT_AMBULATORY_CARE_PROVIDER_SITE_OTHER): Payer: Self-pay

## 2023-07-25 MED ORDER — RIVAROXABAN 20 MG PO TABS: 20.0000 mg | ORAL_TABLET | Freq: Every day | ORAL | 7 refills | Status: DC

## 2023-07-25 NOTE — Telephone Encounter (Signed)
Patient spouse left a message requesting refill for Xarelto to be sent to pharmacy. Patient was made aware that refill has been sent to [facility].

## 2023-11-12 ENCOUNTER — Encounter: Payer: Self-pay | Admitting: Ophthalmology

## 2023-11-19 NOTE — Discharge Instructions (Signed)

## 2023-11-20 ENCOUNTER — Ambulatory Visit: Payer: Medicare PPO | Admitting: Anesthesiology

## 2023-11-20 ENCOUNTER — Encounter: Payer: Self-pay | Admitting: Ophthalmology

## 2023-11-20 ENCOUNTER — Encounter
Admission: RE | Disposition: A | Discharge status: Home / Self Care | Payer: Self-pay | Source: Home / Self Care | Attending: Ophthalmology

## 2023-11-20 ENCOUNTER — Ambulatory Visit
Admission: RE | Admit: 2023-11-20 | Discharge: 2023-11-20 | Disposition: A | Discharge status: Home / Self Care | Payer: Medicare PPO | Attending: Ophthalmology | Admitting: Ophthalmology

## 2023-11-20 ENCOUNTER — Other Ambulatory Visit: Payer: Self-pay

## 2023-11-20 DIAGNOSIS — Z7984 Long term (current) use of oral hypoglycemic drugs: Secondary | ICD-10-CM | POA: Insufficient documentation

## 2023-11-20 DIAGNOSIS — E1122 Type 2 diabetes mellitus with diabetic chronic kidney disease: Secondary | ICD-10-CM | POA: Diagnosis not present

## 2023-11-20 DIAGNOSIS — I129 Hypertensive chronic kidney disease with stage 1 through stage 4 chronic kidney disease, or unspecified chronic kidney disease: Secondary | ICD-10-CM | POA: Diagnosis not present

## 2023-11-20 DIAGNOSIS — H2511 Age-related nuclear cataract, right eye: Secondary | ICD-10-CM | POA: Insufficient documentation

## 2023-11-20 DIAGNOSIS — E1136 Type 2 diabetes mellitus with diabetic cataract: Secondary | ICD-10-CM | POA: Insufficient documentation

## 2023-11-20 DIAGNOSIS — Z794 Long term (current) use of insulin: Secondary | ICD-10-CM | POA: Diagnosis not present

## 2023-11-20 DIAGNOSIS — N1831 Chronic kidney disease, stage 3a: Secondary | ICD-10-CM | POA: Insufficient documentation

## 2023-11-20 HISTORY — PX: CATARACT EXTRACTION W/PHACO: SHX586

## 2023-11-20 HISTORY — DX: Unspecified asthma, uncomplicated: J45.909

## 2023-11-20 HISTORY — DX: Presence of dental prosthetic device (complete) (partial): Z97.2

## 2023-11-20 LAB — GLUCOSE, CAPILLARY: Glucose-Capillary: 119 mg/dL — ABNORMAL HIGH (ref 70–99)

## 2023-11-20 SURGERY — PHACOEMULSIFICATION, CATARACT, WITH IOL INSERTION
Anesthesia: Monitor Anesthesia Care | Site: Eye | Laterality: Right

## 2023-11-20 MED ORDER — FENTANYL CITRATE (PF) 100 MCG/2ML IJ SOLN
INTRAMUSCULAR | Status: AC
  Filled 2023-11-20: qty 2

## 2023-11-20 MED ORDER — MIDAZOLAM HCL 2 MG/2ML IJ SOLN
INTRAMUSCULAR | Status: DC | PRN
  Administered 2023-11-20: 2 mg via INTRAVENOUS

## 2023-11-20 MED ORDER — FENTANYL CITRATE (PF) 100 MCG/2ML IJ SOLN
INTRAMUSCULAR | Status: DC | PRN
  Administered 2023-11-20: 50 ug via INTRAVENOUS

## 2023-11-20 MED ORDER — TETRACAINE HCL 0.5 % OP SOLN
1.0000 [drp] | OPHTHALMIC | Status: DC | PRN
  Administered 2023-11-20 (×3): 1 [drp] via OPHTHALMIC

## 2023-11-20 MED ORDER — SIGHTPATH DOSE#1 BSS IO SOLN
INTRAOCULAR | Status: DC | PRN
  Administered 2023-11-20: 15 mL

## 2023-11-20 MED ORDER — SIGHTPATH DOSE#1 BSS IO SOLN
INTRAOCULAR | Status: DC | PRN
  Administered 2023-11-20: 84 mL via OPHTHALMIC

## 2023-11-20 MED ORDER — MOXIFLOXACIN HCL 0.5 % OP SOLN
OPHTHALMIC | Status: DC | PRN
  Administered 2023-11-20: .2 mL via OPHTHALMIC

## 2023-11-20 MED ORDER — LACTATED RINGERS IV SOLN: Freq: Once | INTRAVENOUS | Status: DC

## 2023-11-20 MED ORDER — BRIMONIDINE TARTRATE-TIMOLOL 0.2-0.5 % OP SOLN
OPHTHALMIC | Status: DC | PRN
  Administered 2023-11-20: 1 [drp] via OPHTHALMIC

## 2023-11-20 MED ORDER — ARMC OPHTHALMIC DILATING DROPS
OPHTHALMIC | Status: AC
  Filled 2023-11-20: qty 0.5

## 2023-11-20 MED ORDER — ARMC OPHTHALMIC DILATING DROPS
1.0000 | OPHTHALMIC | Status: DC | PRN
  Administered 2023-11-20 (×3): 1 via OPHTHALMIC

## 2023-11-20 MED ORDER — MIDAZOLAM HCL 2 MG/2ML IJ SOLN
INTRAMUSCULAR | Status: AC
Start: 2023-11-20 — End: ?
  Filled 2023-11-20: qty 2

## 2023-11-20 MED ORDER — LIDOCAINE HCL (PF) 2 % IJ SOLN
INTRAOCULAR | Status: DC | PRN
  Administered 2023-11-20: 1 mL via INTRAOCULAR

## 2023-11-20 MED ORDER — TETRACAINE HCL 0.5 % OP SOLN
OPHTHALMIC | Status: AC
  Filled 2023-11-20: qty 4

## 2023-11-20 MED ORDER — SIGHTPATH DOSE#1 NA HYALUR & NA CHOND-NA HYALUR IO KIT
PACK | INTRAOCULAR | Status: DC | PRN
  Administered 2023-11-20: 1 via OPHTHALMIC

## 2023-11-20 SURGICAL SUPPLY — 20 items
BNDG EYE OVAL 2 1/8 X 2 5/8 (GAUZE/BANDAGES/DRESSINGS) IMPLANT
CANNULA ANT/CHMB 27G (MISCELLANEOUS) IMPLANT
CANNULA ANT/CHMB 27GA (MISCELLANEOUS)
CATARACT SUITE SIGHTPATH (MISCELLANEOUS) ×1
DISSECTOR HYDRO NUCLEUS 50X22 (MISCELLANEOUS) ×1 IMPLANT
DRSG TEGADERM 2-3/8X2-3/4 SM (GAUZE/BANDAGES/DRESSINGS) ×1 IMPLANT
FEE CATARACT SUITE SIGHTPATH (MISCELLANEOUS) ×1 IMPLANT
GLOVE SURG SYN 7.5 E (GLOVE) ×1
GLOVE SURG SYN 7.5 PF PI (GLOVE) ×1 IMPLANT
GLOVE SURG SYN 8.5 E (GLOVE) ×1
GLOVE SURG SYN 8.5 PF PI (GLOVE) ×1 IMPLANT
LENS IOL TECNIS EYHANCE 20.0 (Intraocular Lens) IMPLANT
NDL FILTER BLUNT 18X1 1/2 (NEEDLE) IMPLANT
NEEDLE FILTER BLUNT 18X1 1/2 (NEEDLE)
PACK VIT ANT 23G (MISCELLANEOUS) IMPLANT
RING MALYGIN 7.0 (MISCELLANEOUS) IMPLANT
SUT ETHILON 10-0 CS-B-6CS-B-6 (SUTURE)
SUTURE EHLN 10-0 CS-B-6CS-B-6 (SUTURE) IMPLANT
SYR 3ML LL SCALE MARK (SYRINGE) IMPLANT
SYR 5ML LL (SYRINGE) IMPLANT

## 2023-11-20 NOTE — Transfer of Care (Signed)
 Immediate Anesthesia Transfer of Care Note  Patient: Craig Donaldson  Procedure(s) Performed: CATARACT EXTRACTION PHACO AND INTRAOCULAR LENS PLACEMENT (IOC) RIGHT DIABETIC (Right: Eye)  Patient Location: PACU  Anesthesia Type: MAC  Level of Consciousness: awake, alert  and patient cooperative  Airway and Oxygen Therapy: Patient Spontanous Breathing and Patient connected to supplemental oxygen  Post-op Assessment: Post-op Vital signs reviewed, Patient's Cardiovascular Status Stable, Respiratory Function Stable, Patent Airway and No signs of Nausea or vomiting  Post-op Vital Signs: Reviewed and stable  Complications: No notable events documented.

## 2023-11-20 NOTE — Anesthesia Preprocedure Evaluation (Signed)
 Anesthesia Evaluation  Patient identified by MRN, date of birth, ID band Patient awake    Reviewed: Allergy & Precautions, H&P , NPO status , Patient's Chart, lab work & pertinent test results, reviewed documented beta blocker date and time   History of Anesthesia Complications Negative for: history of anesthetic complications  Airway Mallampati: II  TM Distance: >3 FB Neck ROM: Full    Dental  (+) Lower Dentures, Upper Dentures   Pulmonary asthma , neg sleep apnea, neg COPD   breath sounds clear to auscultation       Cardiovascular hypertension, Pt. on home beta blockers and Pt. on medications (-) angina (-) Past MI and (-) Cardiac Stents (-) dysrhythmias  Rhythm:regular Rate:Normal     Neuro/Psych negative neurological ROS  negative psych ROS   GI/Hepatic negative GI ROS, Neg liver ROS,,,  Endo/Other  diabetes, Type 2, Insulin  Dependent    Renal/GU      Musculoskeletal   Abdominal   Peds  Hematology negative hematology ROS (+)   Anesthesia Other Findings Past Medical History: No date: Chronic kidney disease, stage 3a (HCC) No date: History of asthma     Comment:  NO ISSUES SINCE THE 1990'S No date: Hyperlipidemia No date: Hypertension No date: Left ureteral calculus No date: Type 2 diabetes mellitus (HCC)  Past Surgical History: 06/04/2013: CYSTOSCOPY WITH RETROGRADE PYELOGRAM, URETEROSCOPY AND  STENT PLACEMENT; Left     Comment:  Procedure: CYSTOSCOPY WITH RETROGRADE PYELOGRAM,               URETEROSCOPY AND STENT PLACEMENT;  Surgeon: Thomasine Oiler, MD;  Location: [facility] [place];                Service: Urology;  Laterality: Left;  1 HR  06/04/2013: HOLMIUM LASER APPLICATION; Left     Comment:  Procedure: HOLMIUM LASER APPLICATION;  Surgeon:               Thomasine Oiler, MD;  Location: [facility];  Service: Urology;  Laterality: Left;  BMI     Body Mass Index: 31.62 kg/m      Reproductive/Obstetrics negative OB ROS                             Anesthesia Physical Anesthesia Plan  ASA: 3  Anesthesia Plan: MAC   Post-op Pain Management: Minimal or no pain anticipated   Induction: Intravenous  PONV Risk Score and Plan: 1 and Midazolam   Airway Management Planned: Natural Airway and Nasal Cannula  Additional Equipment:   Intra-op Plan:   Post-operative Plan:   Informed Consent: I have reviewed the patients History and Physical, chart, labs and discussed the procedure including the risks, benefits and alternatives for the proposed anesthesia with the patient or authorized representative who has indicated his/her understanding and acceptance.     Dental Advisory Given  Plan Discussed with: Anesthesiologist, CRNA and Surgeon  Anesthesia Plan Comments: (Patient consented for risks of anesthesia including but not limited to:  - adverse reactions to medications - damage to eyes, teeth, lips or other oral mucosa - nerve damage due to positioning  - sore throat or hoarseness - Damage to heart, brain, nerves, lungs, other parts of body or loss of life  Patient voiced understanding and assent.)  Anesthesia Quick Evaluation

## 2023-11-20 NOTE — H&P (Signed)
 [facility]   Primary Care Physician:  Alla Amis, MD Ophthalmologist: Dr. Feliciano Ober  Pre-Procedure History & Physical: HPI:  Craig Donaldson is a 73 y.o. male here for cataract surgery.   Past Medical History:  Diagnosis Date   Asthma    Chronic kidney disease, stage 3a (HCC)    History of asthma    NO ISSUES SINCE THE 1990'S   Hyperlipidemia    Hypertension    Left ureteral calculus    Type 2 diabetes mellitus (HCC)    Wears dentures    full upper and lower    Past Surgical History:  Procedure Laterality Date   CATARACT EXTRACTION W/PHACO Left 09/26/2022   Procedure: CATARACT EXTRACTION PHACO AND INTRAOCULAR LENS PLACEMENT (IOC) LEFT  11.88  01:04.4;  Surgeon: Ober Feliciano Hugger, MD;  [facility];  Service: Ophthalmology;  Laterality: Left;   CYSTOSCOPY WITH RETROGRADE PYELOGRAM, URETEROSCOPY AND STENT PLACEMENT Left 06/04/2013   Procedure: CYSTOSCOPY WITH RETROGRADE PYELOGRAM, URETEROSCOPY AND STENT PLACEMENT;  Surgeon: Thomasine Oiler, MD;  Location: [facility] [place];  Service: Urology;  Laterality: Left;  1 HR    HOLMIUM LASER APPLICATION Left 06/04/2013   Procedure: HOLMIUM LASER APPLICATION;  Surgeon: Thomasine Oiler, MD;  Location: [facility] [place];  Service: Urology;  Laterality: Left;   ORIF ANKLE FRACTURE Left 01/20/2022   Procedure: OPEN REDUCTION INTERNAL FIXATION (ORIF) ANKLE FRACTURE;  Surgeon: Marchia Drivers, MD;  Location: [facility];  Service: Orthopedics;  Laterality: Left;   PULMONARY THROMBECTOMY Bilateral 02/18/2022   Procedure: PULMONARY THROMBECTOMY;  Surgeon: Marea Selinda RAMAN, MD;  Location: [facility] INVASIVE CV LAB;  Service: Cardiovascular;  Laterality: Bilateral;    Prior to Admission medications   Medication Sig Start Date End Date Taking? Authorizing Provider  albuterol  (VENTOLIN  HFA) 108 (90 Base) MCG/ACT inhaler Inhale 2 puffs into the lungs in the morning, at noon, in the evening, and at bedtime.  10/29/21  Yes [provider]  atorvastatin  (LIPITOR) 20 MG tablet Take 20 mg by mouth every morning.   Yes [provider]  benazepril  (LOTENSIN ) 5 MG tablet Take 5 mg by mouth daily.   Yes [provider]  calcium  carbonate (CALCIUM  600) 600 MG TABS tablet Take 600 mg by mouth daily.   Yes [provider]  Cholecalciferol  25 MCG (1000 UT) capsule Take 1,000 Units by mouth daily.   Yes [provider]  cyanocobalamin  1000 MCG tablet Take 1,000 mcg by mouth daily.   Yes [provider]  docusate sodium  (COLACE) 100 MG capsule Take 1 capsule (100 mg total) by mouth 2 (two) times daily. 01/22/22  Yes Patsy Lenis, MD  fenofibrate  160 MG tablet Take 160 mg by mouth daily. 12/09/21  Yes [provider]  ferrous fumarate-b12-vitamic C-folic acid (TRINSICON / FOLTRIN) capsule Take 1 capsule by mouth 2 (two) times daily after a meal. 02/19/22  Yes Caleen Qualia, MD  glimepiride  (AMARYL ) 4 MG tablet Take 4 mg by mouth 2 (two) times daily.   Yes [provider]  magnesium  oxide (MAG-OX) 400 MG tablet Take 400 mg by mouth daily.   Yes [provider]  metFORMIN  (GLUCOPHAGE ) 1000 MG tablet Take 1,000 mg by mouth 2 (two) times daily with a meal.   Yes [provider]  metoprolol  succinate (TOPROL -XL) 50 MG 24 hr tablet Take 25 mg by mouth every morning. Take with or immediately following a meal.   Yes [provider]  Omega-3 Fatty Acids (FISH OIL)  1000 MG CAPS Take 1 capsule by mouth daily.   Yes [provider]  pioglitazone (ACTOS) 15 MG tablet Take 15 mg by mouth daily.   Yes [provider]  Potassium Citrate,Elemental K, 99 MG CAPS Take 1 capsule by mouth daily.   Yes [provider]  rivaroxaban  (XARELTO ) 20 MG TABS tablet Take 1 tablet (20 mg total) by mouth daily with supper. 07/25/23  Yes Dew, Selinda RAMAN, MD  senna (SENOKOT) 8.6 MG tablet Take 1 tablet by mouth daily.   Yes [provider]  TOUJEO  MAX SOLOSTAR 300 UNIT/ML Solostar Pen Inject 36 Units into the skin daily. 12/13/21  Yes [provider]  Ubiquinol (QUNOL COQ10/UBIQUINOL/MEGA) 100 MG CAPS Take by mouth daily with supper.   Yes [provider]    Allergies as of 08/25/2023 - Review Complete 03/04/2023  Allergen Reaction Noted   Amoxicillin Rash 06/08/2019   Other Rash 03/25/2017    Family History  Problem Relation Age of Onset   Lung cancer Father    Diabetes Brother     Social History   Socioeconomic History   Marital status: Married    Spouse name: Not on file   Number of children: Not on file   Years of education: Not on file   Highest education level: Not on file  Occupational History   Not on file  Tobacco Use   Smoking status: Never   Smokeless tobacco: Current    Types: Snuff   Tobacco comments:    34 YR TOBACCO USE  Vaping Use   Vaping status: Never Used  Substance and Sexual Activity   Alcohol use: No   Drug use: No   Sexual activity: Not on file  Other Topics Concern   Not on file  Social History Narrative   Not on file   Social Drivers of Health   Financial Resource Strain: Low Risk  (04/22/2023)   Received from [facility]   Overall Financial Resource Strain (CARDIA)    Difficulty of Paying Living Expenses: Not hard at all  Food Insecurity: No Food Insecurity (04/22/2023)   Received from [facility]   Hunger Vital Sign    Worried About Running Out of Food in the Last Year: Never true    Ran Out of Food in the Last Year: Never true  Transportation Needs: No Transportation Needs (04/22/2023)   Received from [facility] - Transportation    In the past 12 months, has lack of transportation kept you from medical appointments or from getting medications?: No    Lack of Transportation (Non-Medical): No  Physical Activity: Not on file  Stress: Not on file  Social Connections: Not on file   Intimate Partner Violence: Not on file    Review of Systems: See HPI, otherwise negative ROS  Physical Exam: BP 133/72   Pulse 65   Temp (!) 97.2 F (36.2 C) (Temporal)   Resp 17   Ht 5' 6 (1.676 m)   Wt 94.7 kg   SpO2 95%   BMI 33.70 kg/m  General:   Alert, cooperative in NAD Head:  Normocephalic and atraumatic. Respiratory:  Normal work of breathing. Cardiovascular:  RRR  Impression/Plan: Craig Donaldson is here for cataract surgery.  Risks, benefits, limitations, and alternatives regarding cataract surgery have been reviewed with the patient.  Questions have been answered.  All parties agreeable.   Feliciano Bryan Ober, MD  11/20/2023, 7:05 AM

## 2023-11-20 NOTE — Op Note (Signed)
 OPERATIVE NOTE  Craig Donaldson [id number] 11/20/2023   PREOPERATIVE DIAGNOSIS: Nuclear sclerotic cataract right eye. H25.11   POSTOPERATIVE DIAGNOSIS: Nuclear sclerotic cataract right eye. H25.11   PROCEDURE:  Phacoemusification with posterior chamber intraocular lens placement of the right eye  Ultrasound time: Procedure(s) with comments: CATARACT EXTRACTION PHACO AND INTRAOCULAR LENS PLACEMENT (IOC) RIGHT DIABETIC (Right) - 15.95 1:18.9  LENS:   Implant Name Type Inv. Item Serial No. Manufacturer Lot No. LRB No. Used Action  LENS IOL TECNIS EYHANCE 20.0 - [id number] Intraocular Lens LENS IOL TECNIS EYHANCE 20.0 [id number] [employer]  Right 1 Implanted      SURGEON:  Feliciano HERO. Enola, MD   ANESTHESIA:  Topical with tetracaine  drops, augmented with 1% preservative-free intracameral lidocaine .   COMPLICATIONS:  None.   DESCRIPTION OF PROCEDURE:  The patient was identified in the holding room and transported to the operating room and placed in the supine position under the operating microscope.  The right eye was identified as the operative eye, which was prepped and draped in the usual sterile ophthalmic fashion.   A 1 millimeter clear-corneal paracentesis was made superotemporally. Preservative-free 1% lidocaine  mixed with 1:1,000 bisulfite-free aqueous solution of epinephrine  was injected into the anterior chamber. The anterior chamber was then filled with Viscoat viscoelastic. A 2.4 millimeter keratome was used to make a clear-corneal incision inferotemporally. A curvilinear capsulorrhexis was made with a cystotome and capsulorrhexis forceps. Balanced salt  solution was used to hydrodissect and hydrodelineate the nucleus. Phacoemulsification was then used to remove the lens nucleus and epinucleus. The remaining cortex was then removed using the irrigation and aspiration handpiece. Provisc was then placed into the capsular bag to distend it for lens placement. A +20.00 D DIB00 intraocular  lens was then injected into the capsular bag. The remaining viscoelastic was aspirated.   Wounds were hydrated with balanced salt  solution.  The anterior chamber was inflated to a physiologic pressure with balanced salt  solution.  No wound leaks were noted. Moxifloxacin  was injected intracamerally.  Timolol  and Brimonidine  drops were applied to the eye.  The patient was taken to the recovery room in stable condition without complications of anesthesia or surgery.  [employer] 11/20/2023, 7:58 AM

## 2023-11-20 NOTE — Anesthesia Postprocedure Evaluation (Signed)
 Anesthesia Post Note  Patient: Lemond FORBES Moon  Procedure(s) Performed: CATARACT EXTRACTION PHACO AND INTRAOCULAR LENS PLACEMENT (IOC) RIGHT DIABETIC (Right: Eye)  Patient location during evaluation: PACU Anesthesia Type: MAC Level of consciousness: awake and alert Pain management: pain level controlled Vital Signs Assessment: post-procedure vital signs reviewed and stable Respiratory status: spontaneous breathing, nonlabored ventilation, respiratory function stable and patient connected to nasal cannula oxygen Cardiovascular status: stable and blood pressure returned to baseline Postop Assessment: no apparent nausea or vomiting Anesthetic complications: no  No notable events documented.   Last Vitals:  Vitals:   11/20/23 0801 11/20/23 0806  BP: 119/73 114/69  Pulse: 63 64  Resp: 12 15  Temp: (!) 36.1 C (!) 36.1 C  SpO2: 98% 96%    Last Pain:  Vitals:   11/20/23 0806  TempSrc:   PainSc: 0-No pain                 Debby Mines

## 2023-11-21 ENCOUNTER — Encounter: Payer: Self-pay | Admitting: Ophthalmology

## 2023-12-30 IMAGING — DX DG CHEST 1V PORT
1 series · 1 of 1 positions shown · non-contrast
Comparison: 10/29/2007

CLINICAL DATA: Preoperative respiratory evaluation for left ankle
surgery.

EXAM:
PORTABLE CHEST 1 VIEW

[chest ap]
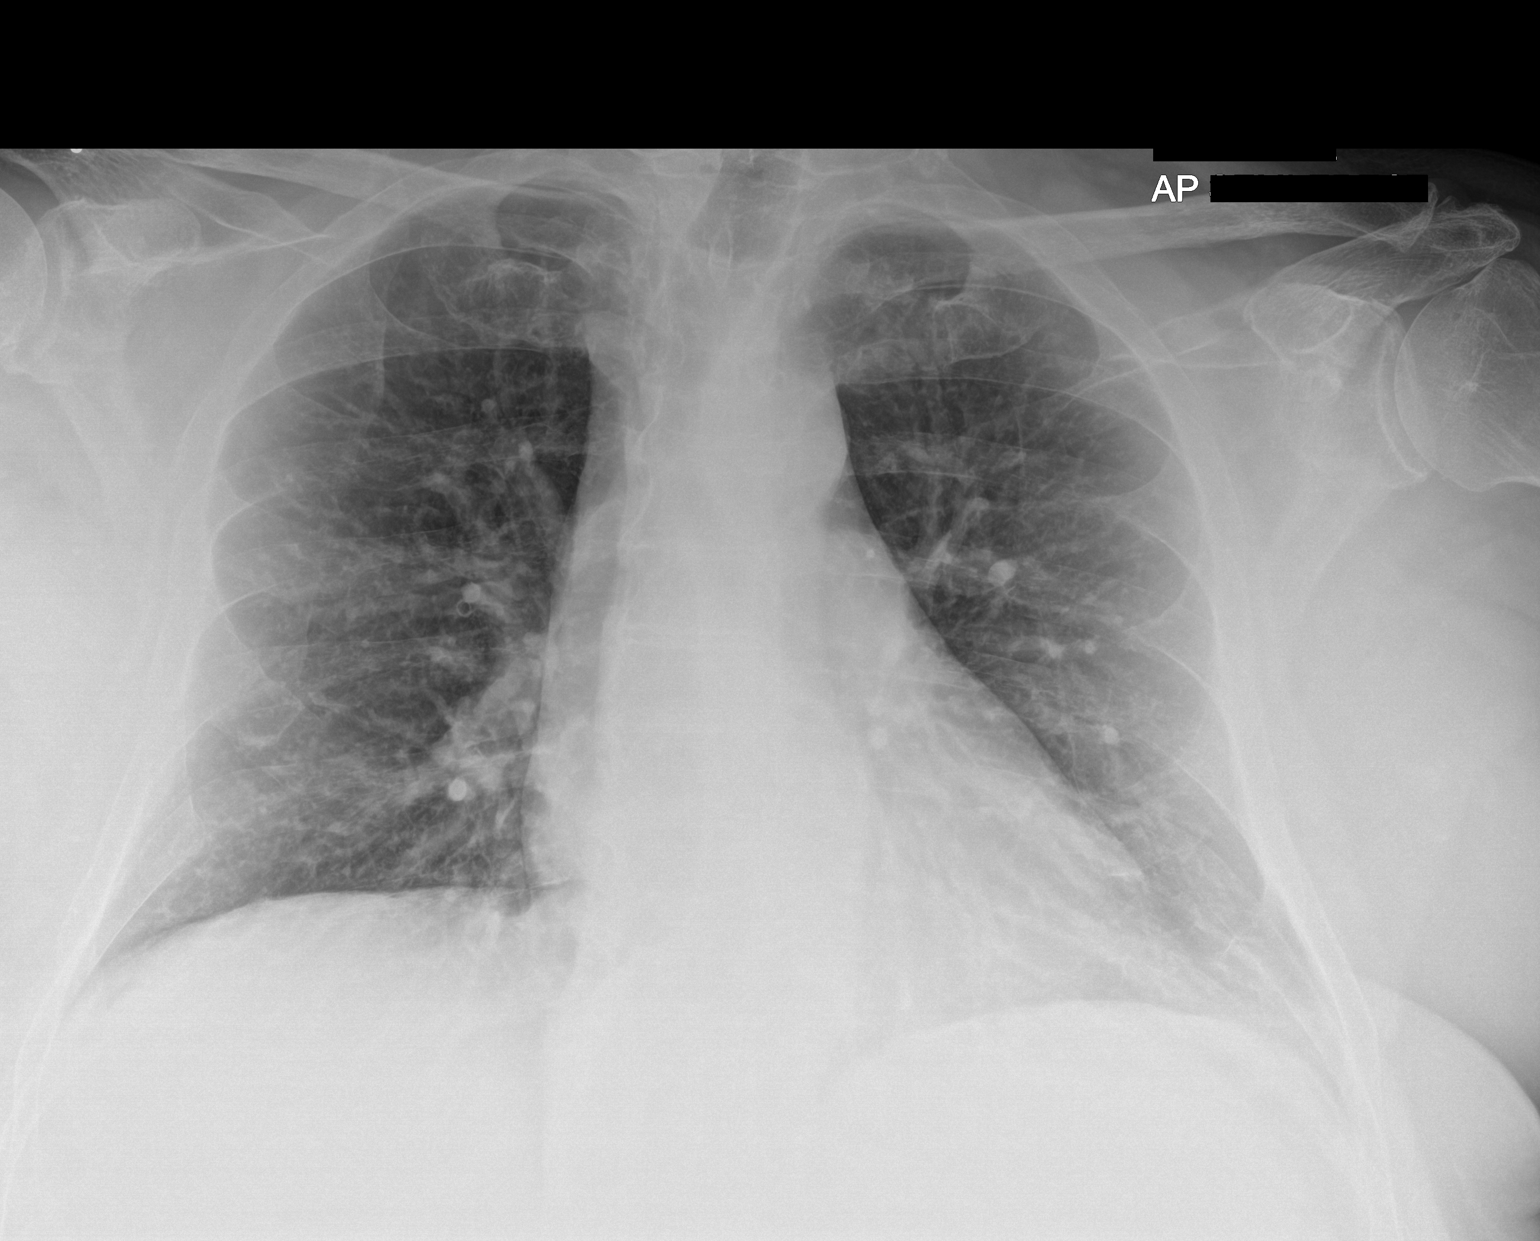

[1 of 1 positions shown; findings below may reference images not displayed]

FINDINGS: 4425 hours. The lungs are clear without focal pneumonia, edema,
pneumothorax or pleural effusion. The cardiopericardial silhouette
is within normal limits for size. The visualized bony structures of
the thorax are unremarkable.
IMPRESSION: No active disease.

## 2023-12-30 IMAGING — DX DG ANKLE COMPLETE 3+V*L*
3 series · 3 of 3 positions shown · non-contrast
Comparison: None.

CLINICAL DATA: Fall, injury to ankle.

EXAM:
LEFT ANKLE COMPLETE - 3+ VIEW

[ankle ap]
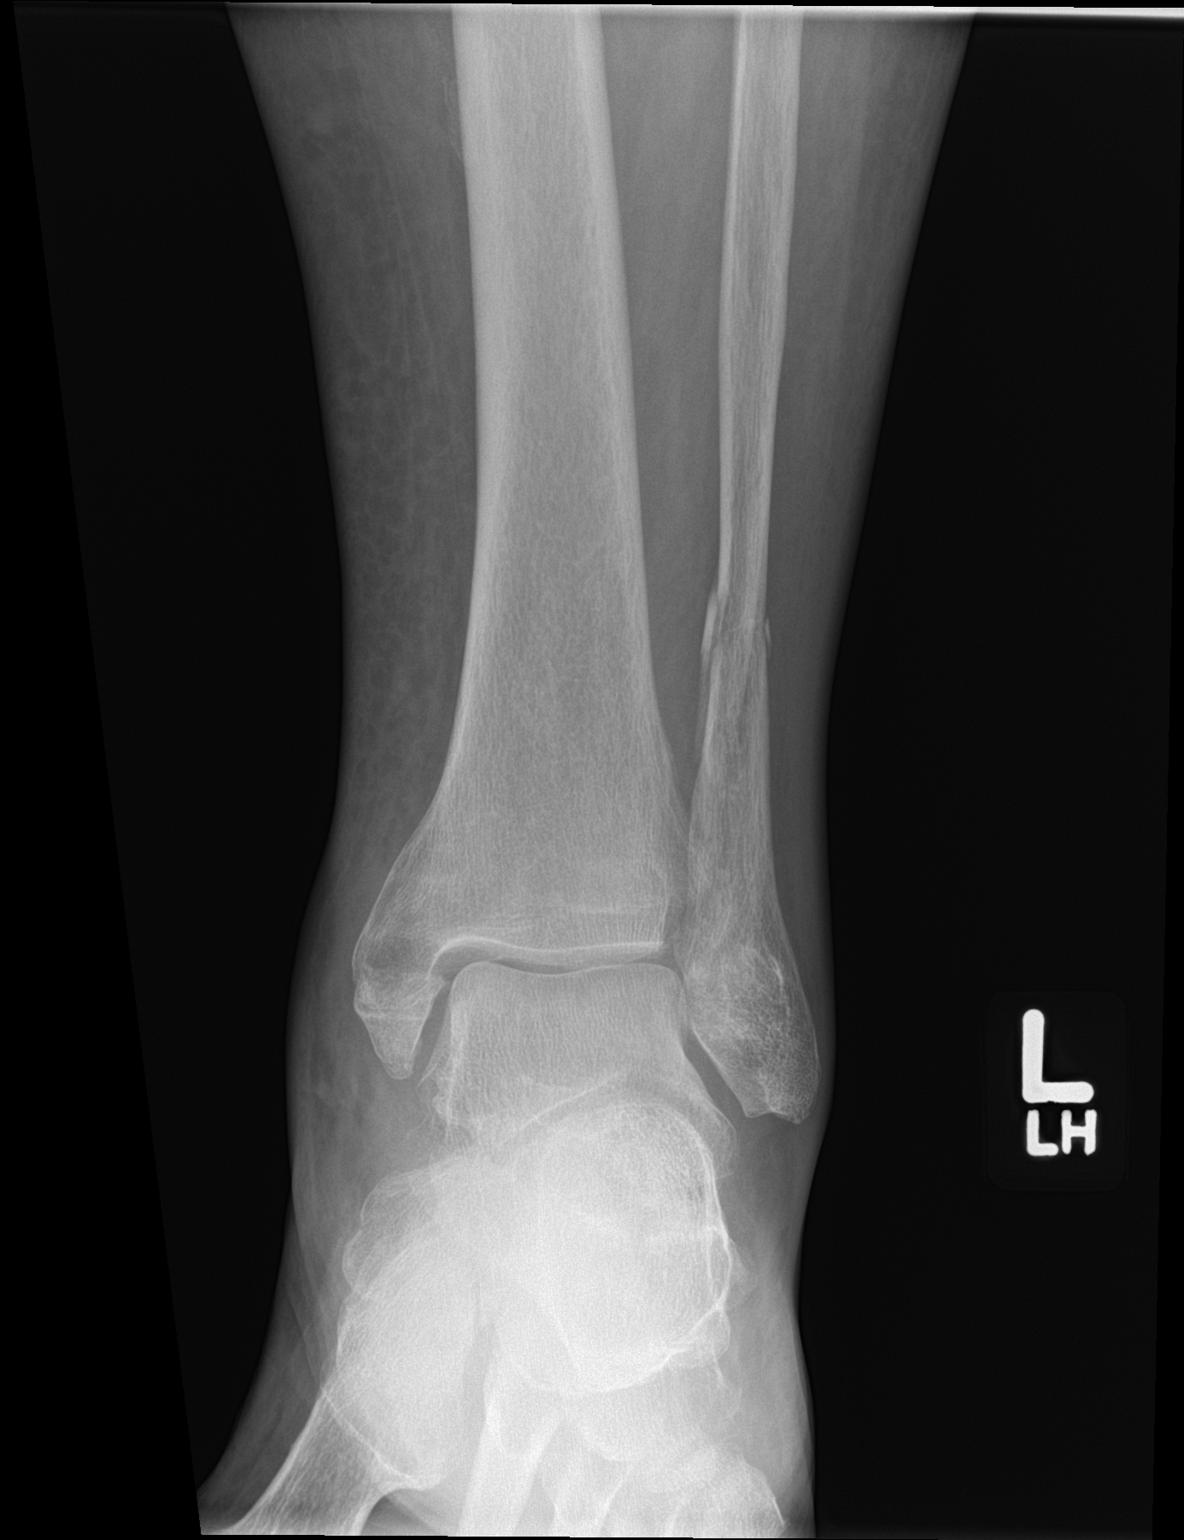

[ankle obl]
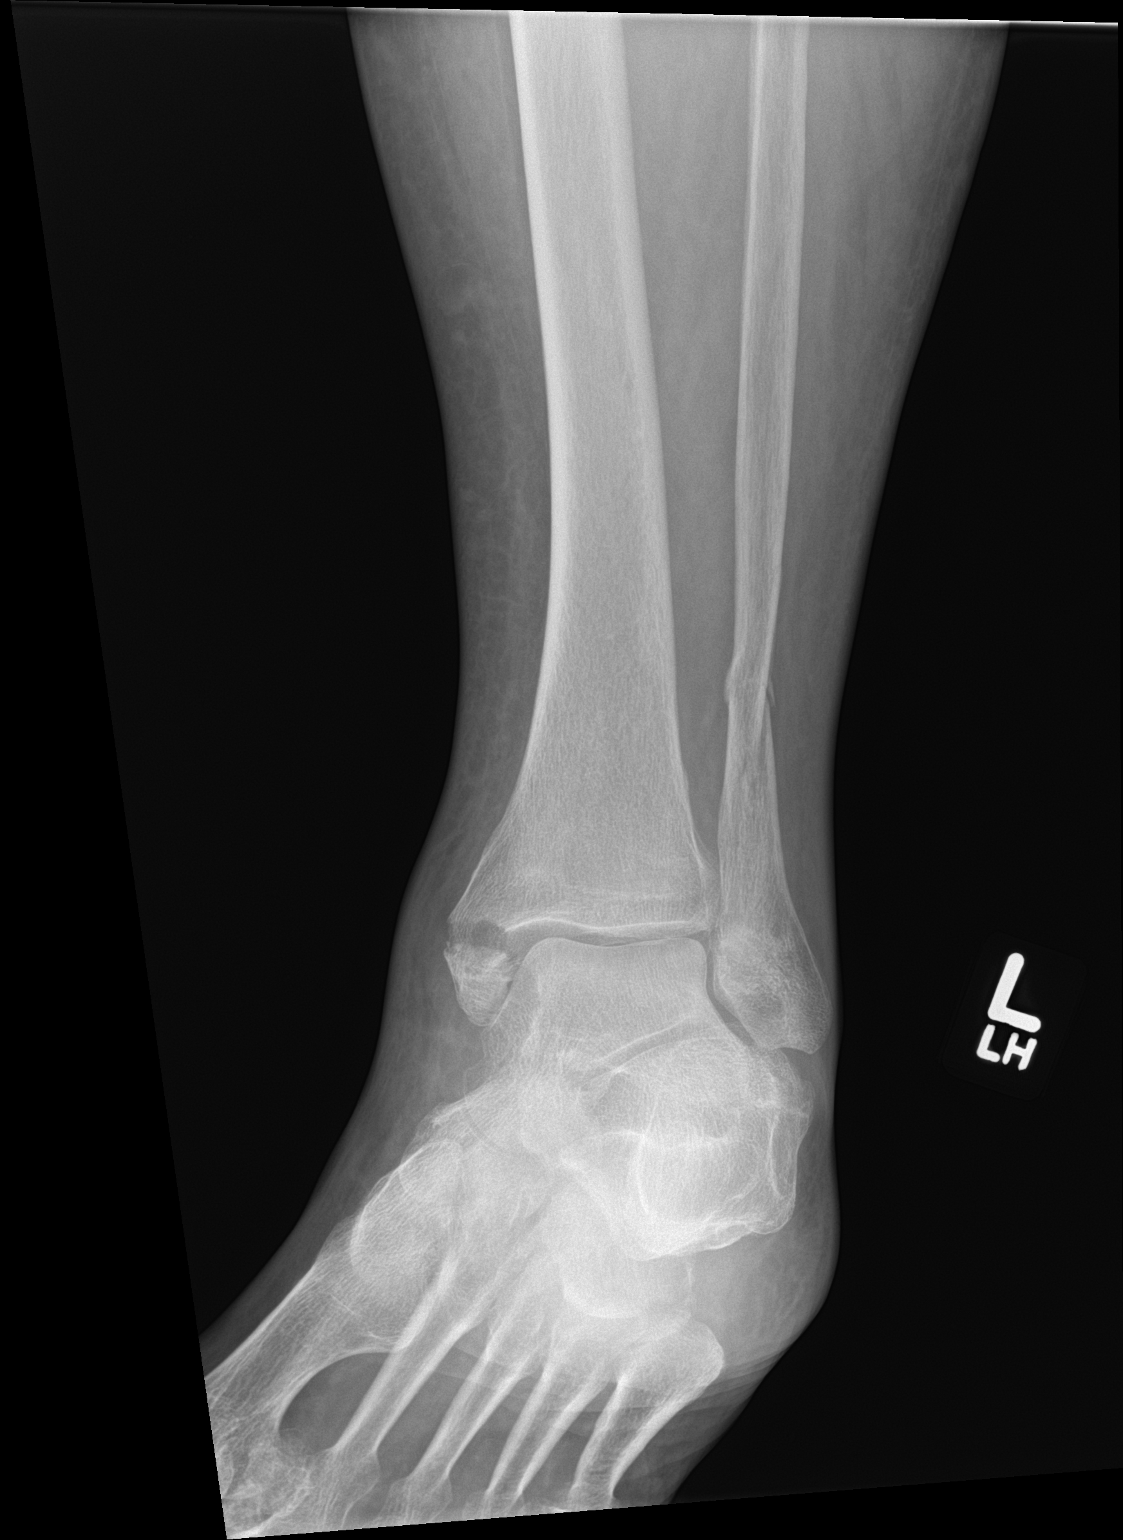

[ankle lat]
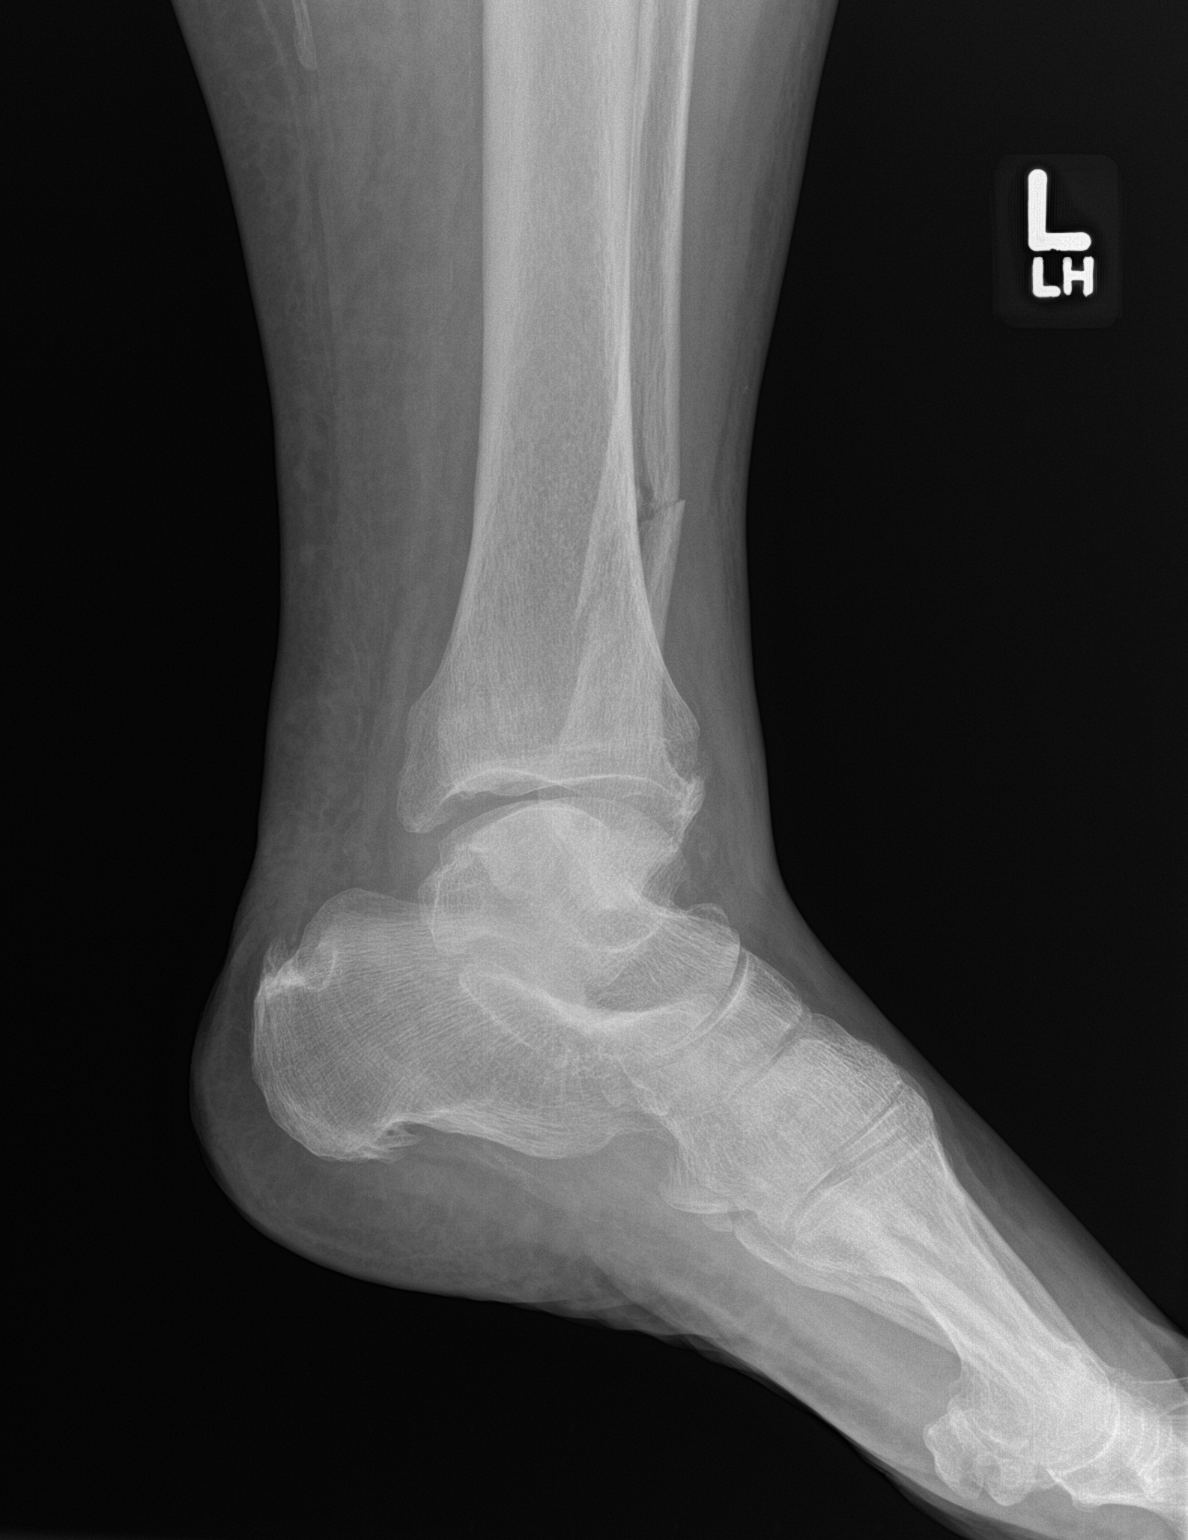

[3 of 3 positions shown; findings below may reference images not displayed]

FINDINGS: Displaced fractures of the medial malleolus and distal fibula. Ankle
mortise remains grossly symmetric. No fracture is seen within the
underlying talus or hindfoot. Soft tissue swelling overlying the
medial malleolus.
IMPRESSION: Displaced fractures of the medial malleolus and distal fibula.
Associated soft tissue swelling.

## 2023-12-31 IMAGING — DX DG ANKLE COMPLETE 3+V*L*
3 series · 3 of 3 positions shown · non-contrast
Comparison: 01/19/2022

CLINICAL DATA: Left ankle fracture.  Status post ORIF.

EXAM:
LEFT ANKLE COMPLETE - 3+ VIEW

[ankle ap]
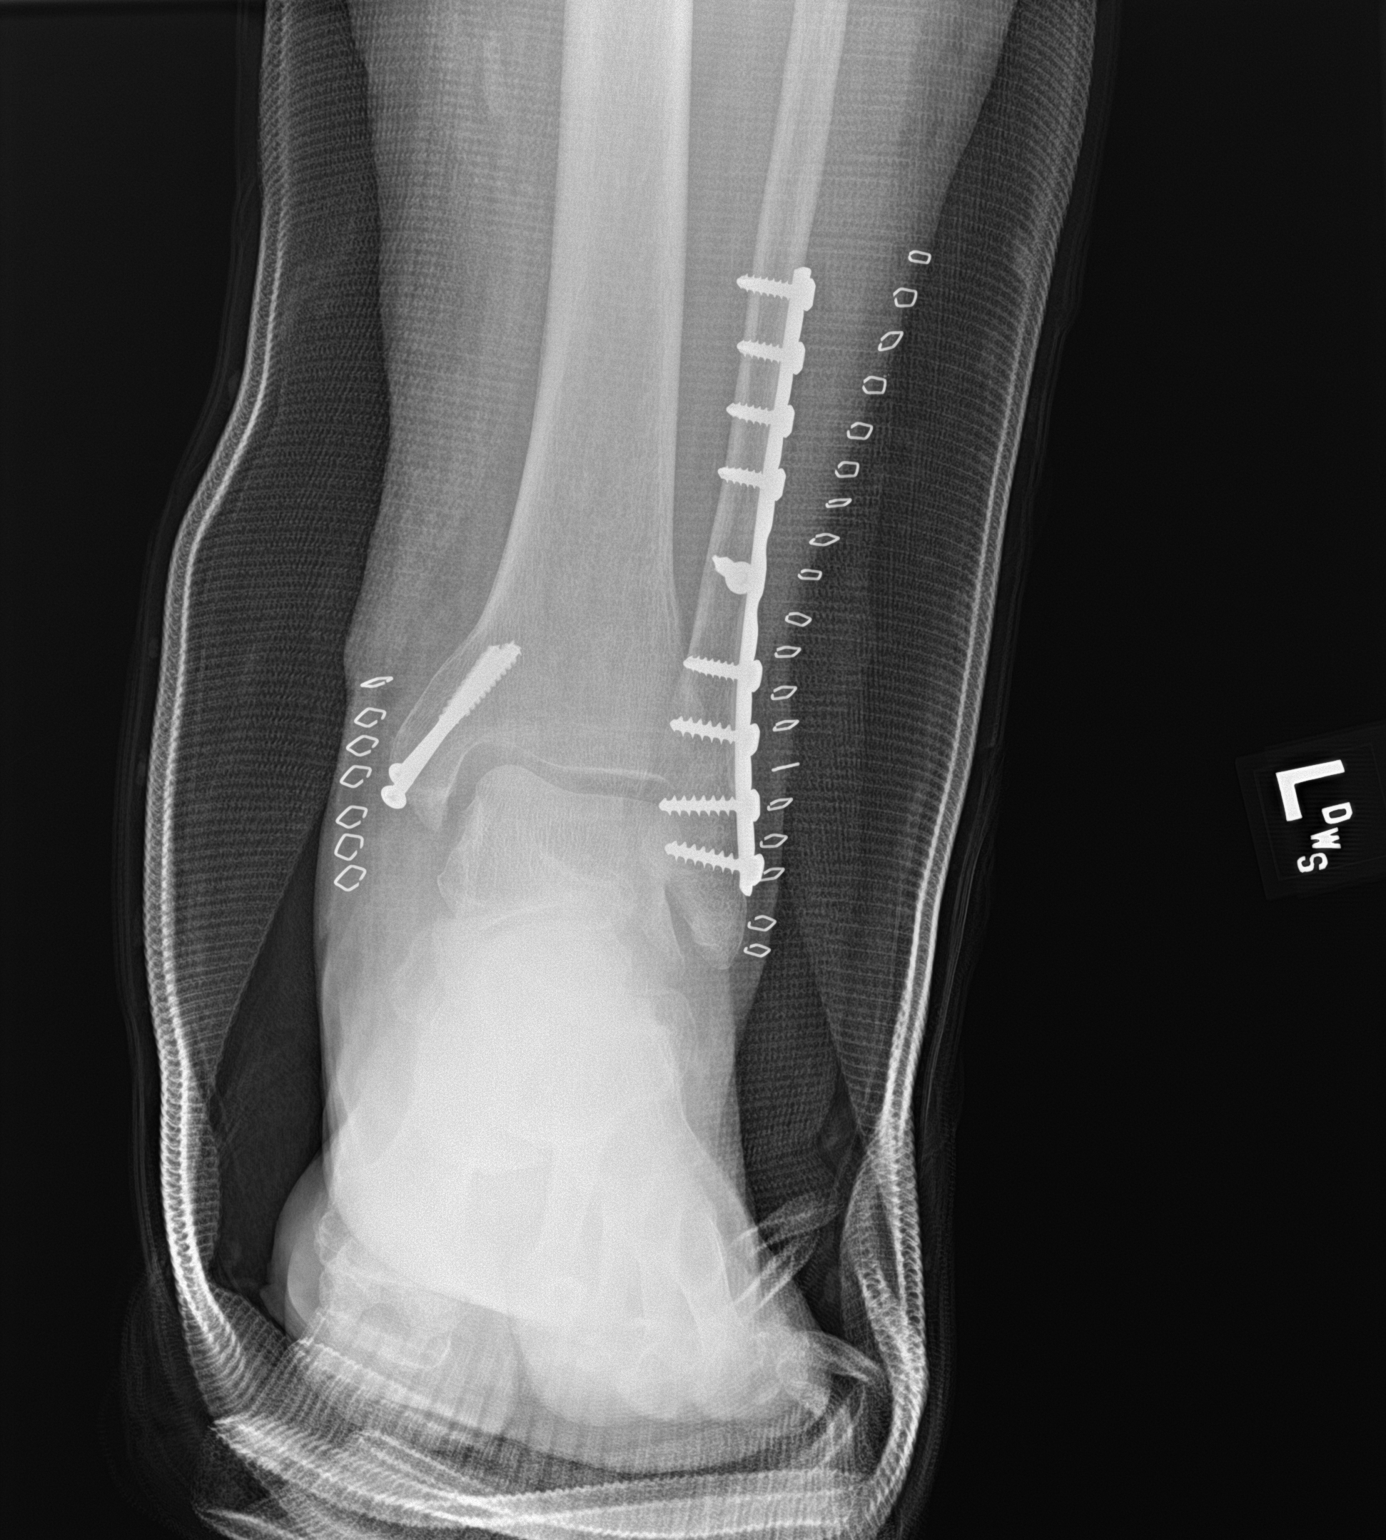

[ankle obl]
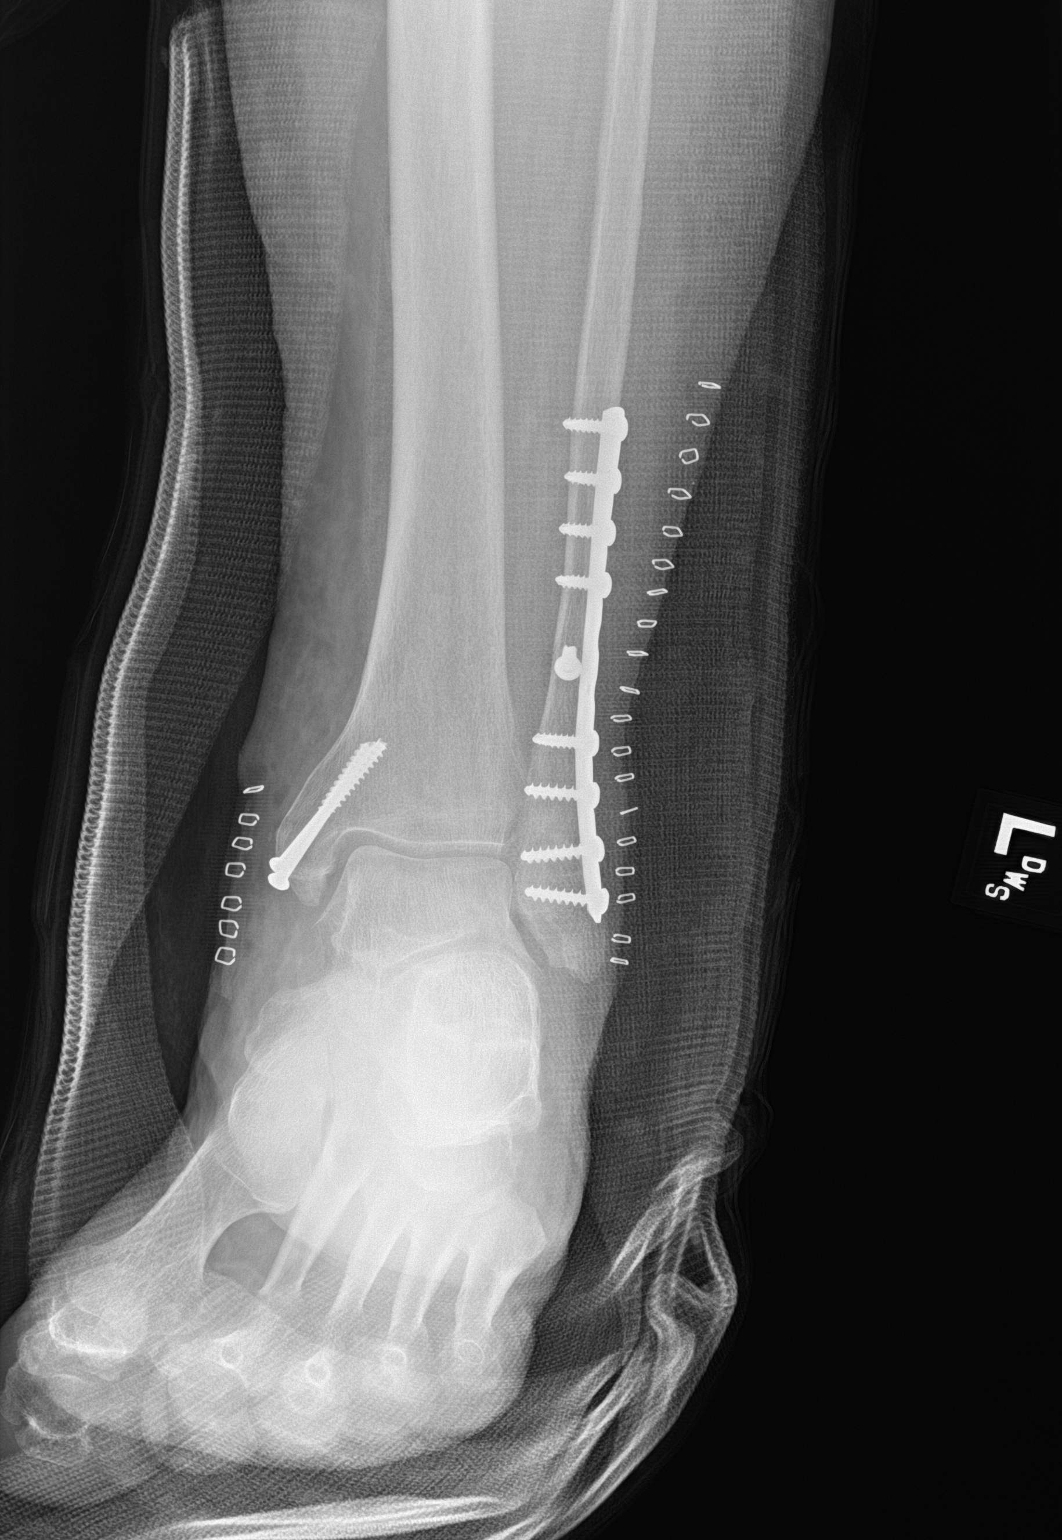

[ankle lat]
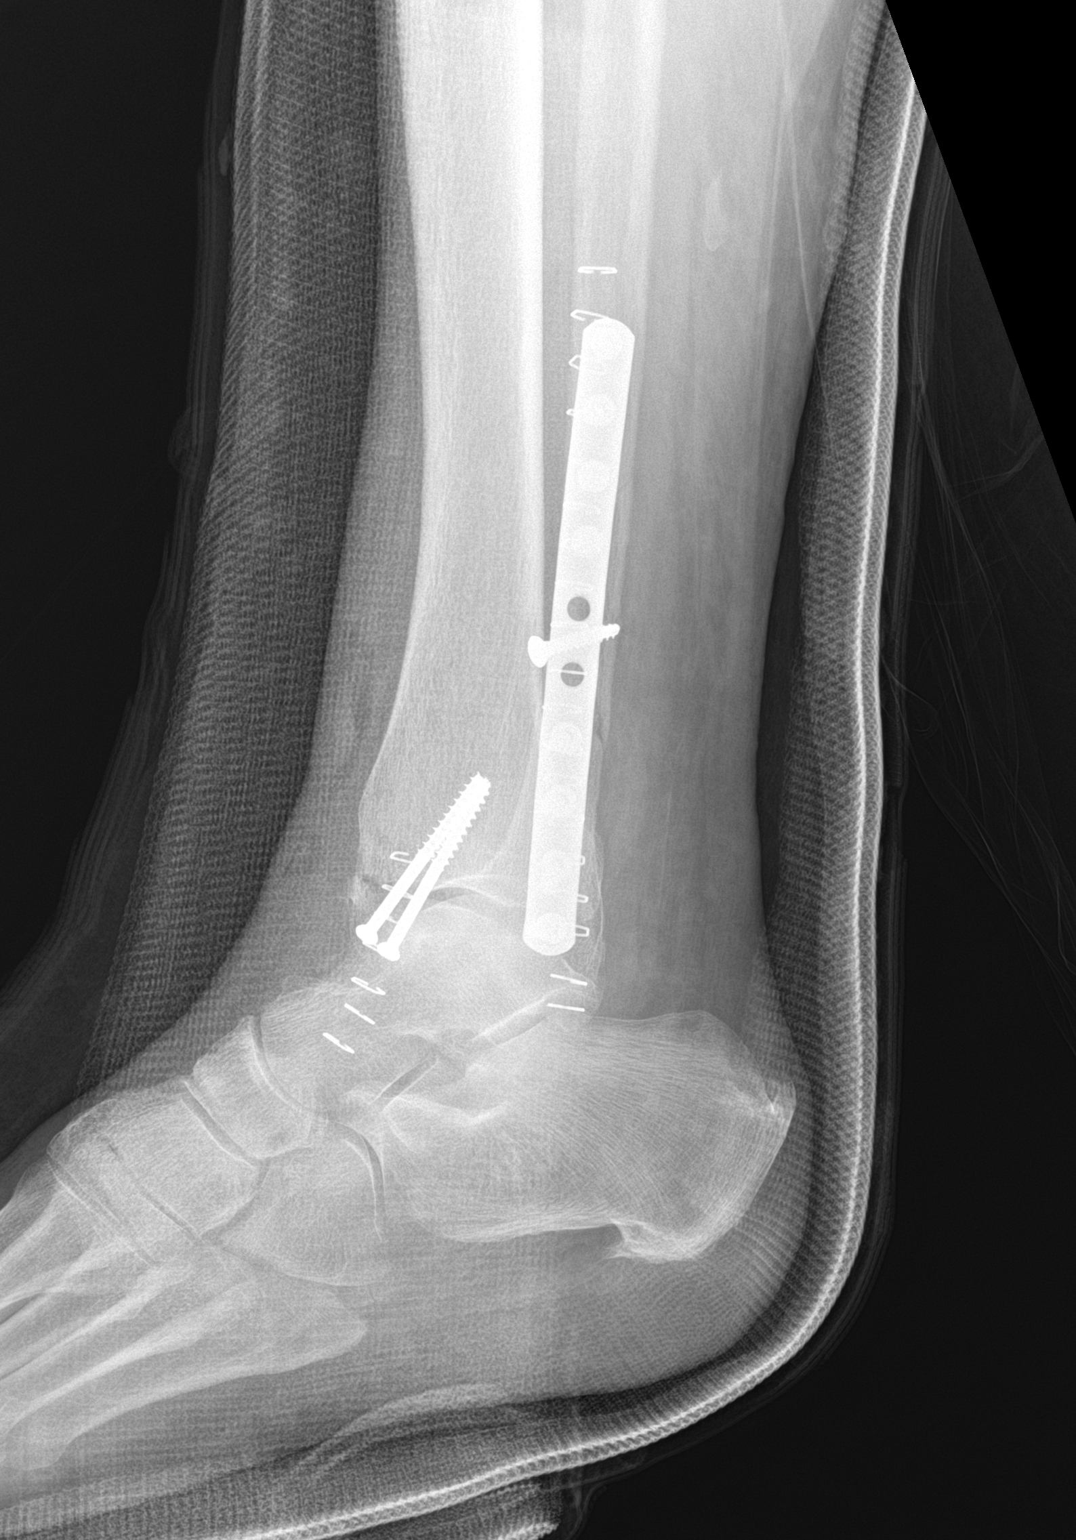

[3 of 3 positions shown; findings below may reference images not displayed]

FINDINGS: Three view study. Fine detail obscured by overlying fiberglass
splint. Patient is status post lateral plate and screw fixation of
the distal fibula fracture with screw fixation of the medial
malleolar fracture fragment. Bony alignment improved compared to the
pre reduction films. No evidence for immediate hardware
complication.
IMPRESSION: Status post ORIF for distal fibula and medial malleolar fractures.
No evidence for immediate hardware complications.

## 2024-01-06 ENCOUNTER — Other Ambulatory Visit (INDEPENDENT_AMBULATORY_CARE_PROVIDER_SITE_OTHER): Payer: Self-pay | Admitting: Vascular Surgery

## 2024-03-02 ENCOUNTER — Ambulatory Visit
Admission: RE | Admit: 2024-03-02 | Discharge: 2024-03-02 | Disposition: A | Discharge status: Home / Self Care | Source: Ambulatory Visit | Attending: Vascular Surgery | Admitting: Vascular Surgery

## 2024-03-02 ENCOUNTER — Encounter (INDEPENDENT_AMBULATORY_CARE_PROVIDER_SITE_OTHER): Payer: Self-pay

## 2024-03-02 DIAGNOSIS — I7121 Aneurysm of the ascending aorta, without rupture: Secondary | ICD-10-CM | POA: Insufficient documentation

## 2024-03-05 ENCOUNTER — Encounter (INDEPENDENT_AMBULATORY_CARE_PROVIDER_SITE_OTHER): Payer: Self-pay | Admitting: Vascular Surgery

## 2024-03-05 ENCOUNTER — Ambulatory Visit (INDEPENDENT_AMBULATORY_CARE_PROVIDER_SITE_OTHER): Admitting: Vascular Surgery

## 2024-03-05 VITALS — BP 120/73 | HR 73 | Resp 18 | Ht 66.0 in | Wt 206.2 lb

## 2024-03-05 DIAGNOSIS — E785 Hyperlipidemia, unspecified: Secondary | ICD-10-CM

## 2024-03-05 DIAGNOSIS — E1169 Type 2 diabetes mellitus with other specified complication: Secondary | ICD-10-CM | POA: Diagnosis not present

## 2024-03-05 DIAGNOSIS — I7121 Aneurysm of the ascending aorta, without rupture: Secondary | ICD-10-CM | POA: Diagnosis not present

## 2024-03-05 DIAGNOSIS — I2699 Other pulmonary embolism without acute cor pulmonale: Secondary | ICD-10-CM

## 2024-03-05 DIAGNOSIS — I10 Essential (primary) hypertension: Secondary | ICD-10-CM | POA: Diagnosis not present

## 2024-03-05 NOTE — Progress Notes (Signed)
 MRN : [id number]  Craig Donaldson is a 73 y.o. (27-Sep-1951) male who presents with chief complaint of  Chief Complaint  Patient presents with   Follow-up    CT results.  .  History of Present Illness: Patient returns today in follow up of his thoracic aortic aneurysm.  He is doing well.  He denies any current severe chest pain, back pain, or signs of peripheral embolization.  He continues on Xarelto  for previous Xarelto  for previous pulmonary embolus and is tolerating this well.  He does have an upcoming colonoscopy later this year and will need to stop his Xarelto  for 3 days prior to the procedure and can resume it the day after. Although this is not yet been read by radiology, I have independently reviewed his CT scan of the chest.  I measured his ascending thoracic aortic aneurysm at approximately 4.2 and 4.3 cm in maximal diameter.  This is minimally changed from the 4.1 cm 1 year ago.   Current Outpatient Medications  Medication Sig Dispense Refill   albuterol  (VENTOLIN  HFA) 108 (90 Base) MCG/ACT inhaler Inhale 2 puffs into the lungs in the morning, at noon, in the evening, and at bedtime.     atorvastatin  (LIPITOR) 20 MG tablet Take 20 mg by mouth every morning.     benazepril  (LOTENSIN ) 5 MG tablet Take 5 mg by mouth daily.     calcium  carbonate (CALCIUM  600) 600 MG TABS tablet Take 600 mg by mouth daily.     Cholecalciferol  25 MCG (1000 UT) capsule Take 1,000 Units by mouth daily.     cyanocobalamin  1000 MCG tablet Take 1,000 mcg by mouth daily.     docusate sodium  (COLACE) 100 MG capsule Take 1 capsule (100 mg total) by mouth 2 (two) times daily. 10 capsule 0   fenofibrate  160 MG tablet Take 160 mg by mouth daily.     ferrous fumarate-b12-vitamic C-folic acid (TRINSICON / FOLTRIN) capsule Take 1 capsule by mouth 2 (two) times daily after a meal. 60 capsule 2   glimepiride  (AMARYL ) 4 MG tablet Take 4 mg by mouth 2 (two) times daily.     magnesium  oxide (MAG-OX) 400 MG tablet Take  400 mg by mouth daily.     metFORMIN  (GLUCOPHAGE ) 1000 MG tablet Take 1,000 mg by mouth 2 (two) times daily with a meal.     metoprolol  succinate (TOPROL -XL) 50 MG 24 hr tablet Take 25 mg by mouth every morning. Take with or immediately following a meal.     Omega-3 Fatty Acids (FISH OIL) 1000 MG CAPS Take 1 capsule by mouth daily.     pioglitazone (ACTOS) 15 MG tablet Take 15 mg by mouth daily.     Potassium Citrate,Elemental K, 99 MG CAPS Take 1 capsule by mouth daily.     senna (SENOKOT) 8.6 MG tablet Take 1 tablet by mouth daily.     TOUJEO  MAX SOLOSTAR 300 UNIT/ML Solostar Pen Inject 36 Units into the skin daily.     Ubiquinol (QUNOL COQ10/UBIQUINOL/MEGA) 100 MG CAPS Take by mouth daily with supper.     XARELTO  20 MG TABS tablet TAKE 1 TABLET BY MOUTH ONCE DAILY WITH SUPPER 90 tablet 0   No current facility-administered medications for this visit.    Past Medical History:  Diagnosis Date   Asthma    Chronic kidney disease, stage 3a (HCC)    History of asthma    NO ISSUES SINCE THE 1990'S   Hyperlipidemia    Hypertension  Left ureteral calculus    Type 2 diabetes mellitus (HCC)    Wears dentures    full upper and lower    Past Surgical History:  Procedure Laterality Date   CATARACT EXTRACTION W/PHACO Left 09/26/2022   Procedure: CATARACT EXTRACTION PHACO AND INTRAOCULAR LENS PLACEMENT (IOC) LEFT  11.88  01:04.4;  Surgeon: Trudi Fus, MD;  Location: [facility];  Service: Ophthalmology;  Laterality: Left;   CATARACT EXTRACTION W/PHACO Right 11/20/2023   Procedure: CATARACT EXTRACTION PHACO AND INTRAOCULAR LENS PLACEMENT (IOC) RIGHT DIABETIC;  Surgeon: Trudi Fus, MD;  Location: [facility];  Service: Ophthalmology;  Laterality: Right;  15.95 1:18.9   CYSTOSCOPY WITH RETROGRADE PYELOGRAM, URETEROSCOPY AND STENT PLACEMENT Left 06/04/2013   Procedure: CYSTOSCOPY WITH RETROGRADE PYELOGRAM, URETEROSCOPY AND STENT PLACEMENT;  Surgeon: Jinny Mounts, MD;  Location: [facility];  Service: Urology;  Laterality: Left;  1 HR    HOLMIUM LASER APPLICATION Left 06/04/2013   Procedure: HOLMIUM LASER APPLICATION;  Surgeon: Jinny Mounts, MD;  Location: [facility];  Service: Urology;  Laterality: Left;   ORIF ANKLE FRACTURE Left 01/20/2022   Procedure: OPEN REDUCTION INTERNAL FIXATION (ORIF) ANKLE FRACTURE;  Surgeon: Rande Bushy, MD;  Location: [facility];  Service: Orthopedics;  Laterality: Left;   PULMONARY THROMBECTOMY Bilateral 02/18/2022   Procedure: PULMONARY THROMBECTOMY;  Surgeon: Celso College, MD;  Location: [facility];  Service: Cardiovascular;  Laterality: Bilateral;     Social History   Tobacco Use   Smoking status: Never   Smokeless tobacco: Current    Types: Snuff   Tobacco comments:    34 YR TOBACCO USE  Vaping Use   Vaping status: Never Used  Substance Use Topics   Alcohol use: No   Drug use: No      Family History  Problem Relation Age of Onset   Lung cancer Father    Diabetes Brother      Allergies  Allergen Reactions   Amoxicillin Rash   Other Rash     REVIEW OF SYSTEMS (Negative unless checked)   Constitutional: [] Weight loss  [] Fever  [] Chills Cardiac: [] Chest pain   [] Chest pressure   [] Palpitations   [] Shortness of breath when laying flat   [] Shortness of breath at rest   [] Shortness of breath with exertion. Vascular:  [] Pain in legs with walking   [] Pain in legs at rest   [] Pain in legs when laying flat   [] Claudication   [] Pain in feet when walking  [] Pain in feet at rest  [] Pain in feet when laying flat   [x] History of DVT   [] Phlebitis   [] Swelling in legs   [] Varicose veins   [] Non-healing ulcers Pulmonary:   [] Uses home oxygen   [] Productive cough   [] Hemoptysis   [] Wheeze  [] COPD   [] Asthma Neurologic:  [] Dizziness  [] Blackouts   [] Seizures   [] History of stroke   [] History of TIA  [] Aphasia   [] Temporary blindness   [] Dysphagia   [] Weakness or  numbness in arms   [] Weakness or numbness in legs Musculoskeletal:  [x] Arthritis   [] Joint swelling   [x] Joint pain   [] Low back pain Hematologic:  [] Easy bruising  [] Easy bleeding   [] Hypercoagulable state   [] Anemic   Gastrointestinal:  [] Blood in stool   [] Vomiting blood  [] Gastroesophageal reflux/heartburn   [] Abdominal pain Genitourinary:  [x] Chronic kidney disease   [] Difficult urination  [] Frequent urination  [] Burning with urination   [] Hematuria Skin:  [] Rashes   []   Ulcers   [] Wounds Psychological:  [] History of anxiety   []  History of major depression.   Physical Examination  BP 120/73   Pulse 73   Resp 18   Ht 5\' 6"  (1.676 m)   Wt 206 lb 3.2 oz (93.5 kg)   BMI 33.28 kg/m  Gen:  WD/WN, NAD Head: [place]/AT, No temporalis wasting. Ear/Nose/Throat: Hearing grossly intact, nares w/o erythema or drainage Eyes: Conjunctiva clear. Sclera non-icteric Neck: Supple.  Trachea midline Pulmonary:  Good air movement, no use of accessory muscles.  Cardiac: RRR, no JVD Vascular:  Vessel Right Left  Radial Palpable Palpable               Musculoskeletal: M/S 5/5 throughout.  No deformity or atrophy.  No edema. Neurologic: Sensation grossly intact in extremities.  Symmetrical.  Speech is fluent.  Psychiatric: Judgment intact, Mood & affect appropriate for pt's clinical situation. Dermatologic: No rashes or ulcers noted.  No cellulitis or open wounds.      Labs No results found for this or any previous visit (from the past 2160 hours).  Radiology No results found.  Assessment/Plan  Ascending aortic aneurysm (HCC) Although this is not yet been read by radiology, I have independently reviewed his CT scan of the chest.  I measured his ascending thoracic aortic aneurysm at approximately 4.2 and 4.3 cm in maximal diameter.  This is minimally changed from the 4.1 cm 1 year ago.  Blood pressure control is of the most importance to keep this from growing.  Continue to follow annually with  CT scan.  Bilateral pulmonary embolism (HCC) Tolerating anticoagulation without any issues.  As such, we will continue his anticoagulation.  It is okay to hold his anticoagulation for 3 days prior to his upcoming colonoscopy and resume it the day following the procedure.   Essential hypertension blood pressure control important in reducing the progression of atherosclerotic disease. On appropriate oral medications.     Type 2 diabetes mellitus with hyperlipidemia (HCC) blood glucose control important in reducing the progression of atherosclerotic disease. Also, involved in wound healing. On appropriate medications.    Mikki Alexander, MD  03/05/2024 11:39 AM    This note was created with Dragon medical transcription system.  Any errors from dictation are purely unintentional

## 2024-03-05 NOTE — Assessment & Plan Note (Signed)
 Although this is not yet been read by radiology, I have independently reviewed his CT scan of the chest.  I measured his ascending thoracic aortic aneurysm at approximately 4.2 and 4.3 cm in maximal diameter.  This is minimally changed from the 4.1 cm 1 year ago.  Blood pressure control is of the most importance to keep this from growing.  Continue to follow annually with CT scan.

## 2024-04-02 ENCOUNTER — Other Ambulatory Visit (INDEPENDENT_AMBULATORY_CARE_PROVIDER_SITE_OTHER): Payer: Self-pay | Admitting: Vascular Surgery

## 2024-04-02 DIAGNOSIS — I2699 Other pulmonary embolism without acute cor pulmonale: Secondary | ICD-10-CM

## 2024-04-02 DIAGNOSIS — I10 Essential (primary) hypertension: Secondary | ICD-10-CM

## 2024-04-02 DIAGNOSIS — E1169 Type 2 diabetes mellitus with other specified complication: Secondary | ICD-10-CM

## 2024-04-02 DIAGNOSIS — I7121 Aneurysm of the ascending aorta, without rupture: Secondary | ICD-10-CM

## 2024-05-05 NOTE — Progress Notes (Signed)
 CC: Preventative Health Exam  HPI  Craig Donaldson is a 73 y.o. here for preventative health exam  Preventative health exam: Acute hyperkalemia present with potassium of 5.4 as of 04/28/2024 secondary to patient taking potassium supplements 1 and half weeks prior to his labs.  States he was taking it because of muscle cramps at night.  Repeat potassium level today remains at 5.4.  He is holding his benazepril  as requested.  Chronic medical issues stable and tolerating medications without adverse effects.  His diabetes is managed by Dr. Cherilyn.  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  No exertional cp or syncopal episodes.  No urinary issues or rectal pain/bleeding.  He is scheduled for GI consultation for future colonoscopy.  No personal or family history of prostate cancer.  Denies any tobacco use.    ROS Review of systems is unremarkable for any active cardiac, respiratory, GI, GU, hematologic, neurologic, dermatologic, HEENT, or psychiatric symptoms except as noted above.  No fevers, chills, or constitutional symptoms.   Current Outpatient Medications  Medication Sig Dispense Refill  . albuterol  MDI, PROVENTIL , VENTOLIN , PROAIR , HFA 90 mcg/actuation inhaler INHALE 2 PUFFS BY MOUTH 4 TIMES DAILY AS NEEDED 18 g 1  . atorvastatin  (LIPITOR) 20 MG tablet Take 1 tablet (20 mg total) by mouth once daily 100 tablet 1  . BD NANO 2ND GEN PEN NEEDLE 32 gauge x 5/32 Ndle USE ONCE DAILY AS DIRECTED 100 each 4  . blood glucose diagnostic (ACCU-CHEK GUIDE TEST STRIPS) test strip USE  STRIP TO CHECK GLUCOSE THREE TIMES DAILY 300 each 4  . blood-glucose meter (ACCU-CHEK GUIDE ME GLUCOSE MTR) Misc Use to check blood sugars; Accu Check Guide Me meter and test supplies; E11.9 1 each 0  . cholecalciferol , vitamin D3, (VITAMIN D3 ORAL) Take by mouth OTC one daily 1000 units    . fenofibrate  160 MG tablet Take 1 tablet (160 mg total) by mouth once daily 100 tablet 1  . FEROCON  110-0.5 mg capsule Take 1 capsule by mouth every morning before breakfast    . glimepiride  (AMARYL ) 4 MG tablet Take 1 tablet by mouth twice daily 180 tablet 3  . insulin  glargine U-300 conc (TOUJEO  MAX U-300 SOLOSTAR) 300 unit/mL (3 mL) InPn Inject 36 Units subcutaneously once daily (Patient taking differently: Inject 32 Units subcutaneously once daily) 12 mL 4  . lancets Use to check blood sugar three times daily dx e11.42 (Patient taking differently: every other day Use to check blood sugar three times daily dx e11.42) 300 each 1  . magnesium  oxide (MAG-OX) 400 mg (241.3 mg magnesium ) tablet Take 400 mg by mouth once daily    . meclizine  (ANTIVERT ) 12.5 mg tablet Take 12.5 mg by mouth 3 (three) times daily as needed    . metFORMIN  (GLUCOPHAGE ) 1000 MG tablet Take 1 tablet by mouth twice daily 180 tablet 1  . pioglitazone (ACTOS) 15 MG tablet Take 1 tablet by mouth once daily 90 tablet 4  . rivaroxaban  (XARELTO ) 20 mg tablet Take 20 mg by mouth daily with dinner    . sennosides (SENOKOT) 8.6 mg tablet Take 1 tablet by mouth once daily     No current facility-administered medications for this visit.    Allergies as of 05/05/2024 - Reviewed 05/05/2024  Allergen Reaction Noted  . Amoxicil-clarithromy-lansopraz Rash 03/25/2017  . Amoxicillin Rash 07/03/2016    Patient Active Problem List  Diagnosis  . Type 2 diabetes mellitus with hyperlipidemia (A1c 7% -  01/08/24) - followed by Dr. Cherilyn  . Essential hypertension  . Mixed hyperlipidemia (LDL 66 - 04/28/24)  . History of diverticulosis  . Non morbid obesity due to excess calories  . Medicare annual wellness visit, initial 01/01/17  . Medicare annual wellness visit, subsequent 10/23/23  . History of normocytic normochromic anemia (Hgb 12.4 - 04/28/24)  . Stage 3b chronic kidney disease (Cr 1.8, GFR 39 - 04/28/24)  . History of pulmonary embolism (02/2022) s/p bone fracture on Xarelto   . Ascending aortic aneurysm (4.2cm - CT 02/2022) -  followed by Dr. Marea    Past Medical History:  Diagnosis Date  . Diabetes mellitus type 2, uncomplicated (CMS/HHS-HCC)   . Essential hypertension   . History of diverticulosis   . Hx of blood clots   . Obesity   . Pure hypercholesterolemia   . Squamous cell carcinoma, leg     Past Surgical History:  Procedure Laterality Date  . COLONOSCOPY  08/29/2004   Diverticulosis  . Kidney stone surgery  2014  . COLONOSCOPY  11/03/2013   Adenomatous Polyps: [facility] 10/2016   . COLONOSCOPY  02/18/2017   Adenomatous Polyps: [facility] 02/2022  . EGD  02/18/2017   Gastritis: No repeat per RTE  . PARATHYROIDECTOMY  06/2018  . ankle surgery Left 02/08/2022    Family History  Problem Relation Name Age of Onset  . Other Mother         Short bowel syndrome  . Lung cancer Father      Social History   Socioeconomic History  . Marital status: Married  Tobacco Use  . Smoking status: Never    Passive exposure: Past  . Smokeless tobacco: Current    Types: Snuff  Vaping Use  . Vaping status: Never Used  Substance and Sexual Activity  . Alcohol use: No    Alcohol/week: 0.0 standard drinks of alcohol  . Drug use: No  . Sexual activity: Defer   Social Drivers of Health   Financial Resource Strain: Low Risk  (05/05/2024)   Overall Financial Resource Strain (CARDIA)   . Difficulty of Paying Living Expenses: Not hard at all  Food Insecurity: No Food Insecurity (05/05/2024)   Hunger Vital Sign   . Worried About Programme researcher, broadcasting/film/video in the Last Year: Never true   . Ran Out of Food in the Last Year: Never true  Transportation Needs: No Transportation Needs (05/05/2024)   PRAPARE - Transportation   . Lack of Transportation (Medical): No   . Lack of Transportation (Non-Medical): No  Housing Stability: Low Risk  (05/05/2024)   Housing Stability Vital Sign   . Unable to Pay for Housing in the Last Year: No   . Number of Times Moved in the Last Year: 0   . Homeless in the Last Year: No    Health  Maintenance  Topic Date Due  . RSV Immunization Pregnant or 60+ (1 - Risk 60-74 years 1-dose series) Never done  . Colorectal Cancer Screening  02/18/2022  . Adult Tetanus (Td And Tdap)  01/19/2023  . Diabetes Education  10/23/2023  . Annual Physical/Well Child Check  04/22/2024  . COVID-19 Vaccine (3 - 2024-25 season) 05/05/2025 (Originally 06/15/2023)  . Pneumococcal Vaccine: 50+ (2 of 2 - PCV) 05/05/2025 (Originally 10/30/2011)  . Influenza Vaccine (1) 06/14/2024  . Monofilament Foot Exam  07/08/2024  . Hemoglobin A1C  07/10/2024  . Diabetes Eye Assessment Exam  08/18/2024  . Depression Screening  10/22/2024  . Medicare Subsequent AWV [phone]  10/23/2024  . Creatinine Level  04/28/2025  . Lipid Panel  04/28/2025  . Potassium Level  05/05/2025  . Serum Bicarbonate  05/05/2025  . Serum Calcium   05/05/2025  . DXA Bone Density Scan  12/25/2027  . Shingrix  Completed  . Hepatitis C Screen  Completed  . Hib Vaccines  Aged Out  . Hepatitis A Vaccines  Aged Out  . Meningococcal B Vaccine  Aged Out  . Meningococcal ACWY Vaccine  Aged Out  . HPV Vaccines  Aged Out  . Annual Urine Albumin Creatinine Ratio  Discontinued  . Serum Phosphorus  Discontinued  . Parathyroid Hormone  Discontinued  . PSA  Discontinued    Vitals:   05/05/24 0943  BP: 138/68  Pulse: 62  SpO2: 97%  Weight: 90.4 kg (199 lb 3.2 oz)  Height: 162.6 cm (5' 4)  PainSc: 0-No pain   Body mass index is 34.19 kg/m.  Exam  General. Well appearing; NAD; VS reviewed     Eyes. Sclera and conjunctiva clear; Vision grossly intact; extraocular movements intact Neck. Supple. No swelling, masses, thyroid normal size, no masses palpated.  Lungs. Respirations unlabored; clear to auscultation bilaterally Cardiovascular. Heart regular rate and rhythm without murmurs, gallops, or rubs Abdomen. Soft; non tender; non distended; normoactive bowel sounds; no masses or organomegaly Lymph Nodes. No significant cervical or  supraclavicular lymphadenopathy noted Musculoskeletal. No deformities; no active joint inflammation Extremities. no edema Skin. Normal color and turgor Pulses. Dorsalis pedis palpable and symmetric bilaterally Neurologic. Alert and oriented x3; CN 2-12 grossly intact; no focal deficits  Assessment and Plan  1. Preventative health exam- Stable exam except for elevated BMI and hyperkalemia.  Advised patient to remain off of benazepril  until we can check a BMET in 1 week.  Stop taking potassium supplementation.  Focus on hydration.  Currently asymptomatic.  CV screening labs reviewed with patient.  Defer diabetic management to Dr. Cherilyn.  Hyperlipidemia at goal.  Chronic anemia worsen with hemoglobin 12.4 secondary to underlying chronic kidney disease which is also worsened.  Creatinine 1.8 and GFR 39.  PSA not indicated (low risk) reviewed and discussed.  GI consultation for colonoscopy pending.  Vaccinations reviewed.  Recommend tetanus booster.  Counseled on nutrition modification and exercise.   Goals Addressed               This Visit's Progress   . * Maintain health/healthy lifestyle (pt-stated)   On track   . * Patient Goals (pt-stated)   On track     Weight loss and keep A1c down       Follow up: 6 months for recheck and subsequent Medicare wellness.  BMET 1 week.  Other labs prior to next visit.  GI consultation for colonoscopy pending  ALDA CARPEN, MD *Some images could not be shown.

## 2024-06-11 ENCOUNTER — Emergency Department
Admission: EM | Admit: 2024-06-11 | Discharge: 2024-06-11 | Disposition: A | Discharge status: Home / Self Care | Attending: Emergency Medicine | Admitting: Emergency Medicine

## 2024-06-11 ENCOUNTER — Other Ambulatory Visit: Payer: Self-pay

## 2024-06-11 DIAGNOSIS — E162 Hypoglycemia, unspecified: Secondary | ICD-10-CM | POA: Diagnosis present

## 2024-06-11 DIAGNOSIS — Z7984 Long term (current) use of oral hypoglycemic drugs: Secondary | ICD-10-CM | POA: Diagnosis not present

## 2024-06-11 DIAGNOSIS — I10 Essential (primary) hypertension: Secondary | ICD-10-CM | POA: Insufficient documentation

## 2024-06-11 DIAGNOSIS — Z794 Long term (current) use of insulin: Secondary | ICD-10-CM | POA: Insufficient documentation

## 2024-06-11 DIAGNOSIS — E11649 Type 2 diabetes mellitus with hypoglycemia without coma: Secondary | ICD-10-CM | POA: Insufficient documentation

## 2024-06-11 LAB — URINALYSIS, ROUTINE W REFLEX MICROSCOPIC
Bilirubin Urine: NEGATIVE
Glucose, UA: NEGATIVE mg/dL
Hgb urine dipstick: NEGATIVE
Ketones, ur: NEGATIVE mg/dL
Leukocytes,Ua: NEGATIVE
Nitrite: NEGATIVE
Protein, ur: NEGATIVE mg/dL
Specific Gravity, Urine: 1.016 (ref 1.005–1.030)
pH: 5 (ref 5.0–8.0)

## 2024-06-11 LAB — CBC
HCT: 38.6 % — ABNORMAL LOW (ref 39.0–52.0)
Hemoglobin: 12.3 g/dL — ABNORMAL LOW (ref 13.0–17.0)
MCH: 30.1 pg (ref 26.0–34.0)
MCHC: 31.9 g/dL (ref 30.0–36.0)
MCV: 94.4 fL (ref 80.0–100.0)
Platelets: 398 K/uL (ref 150–400)
RBC: 4.09 MIL/uL — ABNORMAL LOW (ref 4.22–5.81)
RDW: 15.3 % (ref 11.5–15.5)
WBC: 12.6 K/uL — ABNORMAL HIGH (ref 4.0–10.5)
nRBC: 0 % (ref 0.0–0.2)

## 2024-06-11 LAB — BASIC METABOLIC PANEL WITH GFR
Anion gap: 10 (ref 5–15)
BUN: 32 mg/dL — ABNORMAL HIGH (ref 8–23)
CO2: 23 mmol/L (ref 22–32)
Calcium: 9.2 mg/dL (ref 8.9–10.3)
Chloride: 105 mmol/L (ref 98–111)
Creatinine, Ser: 1.58 mg/dL — ABNORMAL HIGH (ref 0.61–1.24)
GFR, Estimated: 46 mL/min — ABNORMAL LOW (ref >60)
Glucose, Bld: 92 mg/dL (ref 70–99)
Potassium: 3.7 mmol/L (ref 3.5–5.1)
Sodium: 138 mmol/L (ref 135–145)

## 2024-06-11 LAB — CBG MONITORING, ED
Glucose-Capillary: 96 mg/dL (ref 70–99)
Glucose-Capillary: 151 mg/dL — ABNORMAL HIGH (ref 70–99)
Glucose-Capillary: 176 mg/dL — ABNORMAL HIGH (ref 70–99)
Glucose-Capillary: 179 mg/dL — ABNORMAL HIGH (ref 70–99)

## 2024-06-11 LAB — TROPONIN I (HIGH SENSITIVITY): Troponin I (High Sensitivity): 6 ng/L (ref <18)

## 2024-06-11 LAB — TSH: TSH: 2.07 u[IU]/mL (ref 0.350–4.500)

## 2024-06-11 NOTE — Discharge Instructions (Signed)
 Continue taking your long-acting insulin  starting tomorrow morning.  You can continue taking your metformin  as well.  Pause the glimepiride  for the next 1 to 2 days.  You should monitor your sugar at least twice daily, once in the morning and once in the evening.  Return to the ER for new, worsening, or persistent severe low blood sugar readings, weakness or lightheadedness, recurrent episodes of passing out or feel like you are about to pass out, or any other new or worsening symptoms that concern you.

## 2024-06-11 NOTE — ED Notes (Signed)
 PT in no acute distress prior to discharge. Discharged instructions reviewed and pt stated that they understand directions. Pt has all belongings with them at time of discharge.

## 2024-06-11 NOTE — ED Triage Notes (Addendum)
 Pt sts that he has been having low BGL. Pt sts that he called EMS out to the house due to his BGL being in the 20's. Pt received sugar from them and ate a PB&J sandwich. Pt sts that at 1400 his BGL was 77 from the 177 a hr previous that EMS got. Pt sts that he has been feeling swimmy headed since then. Pt sts that on the way here he ate a muffin and crackers.

## 2024-06-11 NOTE — ED Provider Notes (Signed)
 [facility] Provider Note    Event Date/Time   First MD Initiated Contact with Patient 06/11/24 1514     (approximate)   History   Hypoglycemia   HPI  Craig Donaldson is a 73 y.o. male with a history of diabetes, hypertension, and hypercholesterolemia who presents with hypoglycemia.  The patient states that his sugar has been lower than normal for a few weeks, sometimes as low as 39 when he wakes up in the morning, but over the last 2 days this has gotten worse, today he had a syncopal episode due to hypoglycemia before lunch.  EMS came and checked his sugar and it was in the 40s.  Subsequently he got glucose and then ate and went as high as 177, but then was down to the 70s and a short amount of time.  He is on a long-acting insulin  that he takes in the morning.  He is also on 2 oral medications and took all of them today.  I reviewed the past medical records.  The patient was most recently seen by Dr. Alla from primary care on 7/23 for a preventative health exam.  Per that chart, he is on glimepiride , metformin , and insulin  glargine.   Physical Exam   Triage Vital Signs: ED Triage Vitals  Encounter Vitals Group     BP 06/11/24 1451 (!) 147/85     Girls Systolic BP Percentile --      Girls Diastolic BP Percentile --      Boys Systolic BP Percentile --      Boys Diastolic BP Percentile --      Pulse Rate 06/11/24 1451 64     Resp 06/11/24 1451 17     Temp 06/11/24 1451 98.1 F (36.7 C)     Temp Source 06/11/24 1451 Oral     SpO2 06/11/24 1451 98 %     Weight 06/11/24 1452 200 lb (90.7 kg)     Height 06/11/24 1452 5' 5 (1.651 m)     Head Circumference --      Peak Flow --      Pain Score 06/11/24 1452 0     Pain Loc --      Pain Education --      Exclude from Growth Chart --     Most recent vital signs: Vitals:   06/11/24 1800 06/11/24 1830  BP: 131/72 128/75  Pulse: 67 (!) 32  Resp: 17 14  Temp:    SpO2: 97% 96%      General: Awake, well-appearing, no distress.  CV:  Good peripheral perfusion.  Resp:  Normal effort.  Abd:  No distention.  Other:  Moist mucous membranes.   ED Results / Procedures / Treatments   Labs (all labs ordered are listed, but only abnormal results are displayed) Labs Reviewed  CBC - Abnormal; Notable for the following components:      Result Value   WBC 12.6 (*)    RBC 4.09 (*)    Hemoglobin 12.3 (*)    HCT 38.6 (*)    All other components within normal limits  BASIC METABOLIC PANEL WITH GFR - Abnormal; Notable for the following components:   BUN 32 (*)    Creatinine, Ser 1.58 (*)    GFR, Estimated 46 (*)    All other components within normal limits  URINALYSIS, ROUTINE W REFLEX MICROSCOPIC - Abnormal; Notable for the following components:   Color, Urine YELLOW (*)    APPearance CLEAR (*)  All other components within normal limits  CBG MONITORING, ED - Abnormal; Notable for the following components:   Glucose-Capillary 151 (*)    All other components within normal limits  CBG MONITORING, ED - Abnormal; Notable for the following components:   Glucose-Capillary 176 (*)    All other components within normal limits  CBG MONITORING, ED - Abnormal; Notable for the following components:   Glucose-Capillary 179 (*)    All other components within normal limits  TSH  CBG MONITORING, ED  CBG MONITORING, ED  CBG MONITORING, ED  CBG MONITORING, ED  TROPONIN I (HIGH SENSITIVITY)     EKG  ED ECG REPORT I, Waylon Cassis, the attending physician, personally viewed and interpreted this ECG.  Date: 06/11/2024 EKG Time: 1603 Rate: 63 Rhythm: sinus rhythm QRS Axis: Left axis Intervals: normal ST/T Wave abnormalities: normal Narrative Interpretation: no evidence of acute ischemia    RADIOLOGY    PROCEDURES:  Critical Care performed: No  Procedures   MEDICATIONS ORDERED IN ED: Medications - No data to display   IMPRESSION / MDM /  ASSESSMENT AND PLAN / ED COURSE  I reviewed the triage vital signs and the nursing notes.  73 year old male with PMH as noted above presents with recurrent hypotension yesterday and today with a resulting syncopal episode today, despite eating a full breakfast.  On exam his vital signs are normal and he is alert and well-appearing.  Physical exam is otherwise unremarkable.  Differential diagnosis includes, but is not limited to, hypoglycemia due to his long-acting insulin  or oral medications, dehydration, renal insufficiency, electrolyte abnormality, UTI or other infection.  We will obtain labs, serial fingersticks, and reassess.  Patient's presentation is most consistent with acute presentation with potential threat to life or bodily function.  The patient is on the cardiac monitor to evaluate for evidence of arrhythmia and/or significant heart rate changes.  ----------------------------------------- 7:12 PM on 06/11/2024 -----------------------------------------  Lab workup is unremarkable.  Creatinine is stable from prior.  Electrolytes are unremarkable.  CBC shows mild leukocytosis.  Troponin is negative.  Urinalysis is negative.  There is no evidence of acute infection.  The patient's blood glucose has remained stable for the last several hours.    I did consider whether he may benefit from inpatient admission for further monitoring.  However, given the negative workup, lack of any recurrent symptoms, and stable blood glucose, I think he is appropriate for discharge home.  The patient himself would strongly prefer to go home.  I instructed him to continue his insulin  and metformin  but pause his glimepiride  for the next 1 to 2 days and check his blood sugar frequently.  He agrees to contact his PMD for follow-up.  I gave strict return precautions, and he expressed understanding.  FINAL CLINICAL IMPRESSION(S) / ED DIAGNOSES   Final diagnoses:  Hypoglycemia     Rx / DC Orders   ED  Discharge Orders     None        Note:  This document was prepared using Dragon voice recognition software and may include unintentional dictation errors.    Cassis Waylon, MD 06/11/24 [phone]

## 2024-07-28 ENCOUNTER — Ambulatory Visit: Admitting: Anesthesiology

## 2024-07-28 ENCOUNTER — Ambulatory Visit
Admission: RE | Admit: 2024-07-28 | Discharge: 2024-07-28 | Disposition: A | Discharge status: Home / Self Care | Attending: Gastroenterology | Admitting: Gastroenterology

## 2024-07-28 ENCOUNTER — Encounter: Payer: Self-pay | Admitting: Gastroenterology

## 2024-07-28 ENCOUNTER — Encounter
Admission: RE | Disposition: A | Discharge status: Home / Self Care | Payer: Self-pay | Source: Home / Self Care | Attending: Gastroenterology

## 2024-07-28 DIAGNOSIS — Z794 Long term (current) use of insulin: Secondary | ICD-10-CM | POA: Diagnosis not present

## 2024-07-28 DIAGNOSIS — N1831 Chronic kidney disease, stage 3a: Secondary | ICD-10-CM | POA: Diagnosis not present

## 2024-07-28 DIAGNOSIS — Z1211 Encounter for screening for malignant neoplasm of colon: Secondary | ICD-10-CM | POA: Diagnosis present

## 2024-07-28 DIAGNOSIS — K5730 Diverticulosis of large intestine without perforation or abscess without bleeding: Secondary | ICD-10-CM | POA: Diagnosis not present

## 2024-07-28 DIAGNOSIS — E1122 Type 2 diabetes mellitus with diabetic chronic kidney disease: Secondary | ICD-10-CM | POA: Diagnosis not present

## 2024-07-28 DIAGNOSIS — Z79899 Other long term (current) drug therapy: Secondary | ICD-10-CM | POA: Insufficient documentation

## 2024-07-28 DIAGNOSIS — Z7984 Long term (current) use of oral hypoglycemic drugs: Secondary | ICD-10-CM | POA: Diagnosis not present

## 2024-07-28 DIAGNOSIS — I129 Hypertensive chronic kidney disease with stage 1 through stage 4 chronic kidney disease, or unspecified chronic kidney disease: Secondary | ICD-10-CM | POA: Insufficient documentation

## 2024-07-28 DIAGNOSIS — J45909 Unspecified asthma, uncomplicated: Secondary | ICD-10-CM | POA: Insufficient documentation

## 2024-07-28 HISTORY — PX: COLONOSCOPY: SHX5424

## 2024-07-28 LAB — GLUCOSE, CAPILLARY: Glucose-Capillary: 134 mg/dL — ABNORMAL HIGH (ref 70–99)

## 2024-07-28 SURGERY — COLONOSCOPY
Anesthesia: General

## 2024-07-28 MED ORDER — LIDOCAINE HCL (CARDIAC) PF 100 MG/5ML IV SOSY
PREFILLED_SYRINGE | INTRAVENOUS | Status: DC | PRN
  Administered 2024-07-28: 60 mg via INTRAVENOUS

## 2024-07-28 MED ORDER — PROPOFOL 500 MG/50ML IV EMUL
INTRAVENOUS | Status: DC | PRN
  Administered 2024-07-28: 75 ug/kg/min via INTRAVENOUS

## 2024-07-28 MED ORDER — DEXMEDETOMIDINE HCL IN NACL 80 MCG/20ML IV SOLN
INTRAVENOUS | Status: AC
Start: 2024-07-28 — End: 2024-07-28
  Filled 2024-07-28: qty 20

## 2024-07-28 MED ORDER — PROPOFOL 10 MG/ML IV BOLUS
INTRAVENOUS | Status: DC | PRN
  Administered 2024-07-28: 50 mg via INTRAVENOUS

## 2024-07-28 MED ORDER — SODIUM CHLORIDE 0.9 % IV SOLN
INTRAVENOUS | Status: DC
  Administered 2024-07-28: 20 mL/h via INTRAVENOUS

## 2024-07-28 MED ORDER — PROPOFOL 1000 MG/100ML IV EMUL
INTRAVENOUS | Status: AC
  Filled 2024-07-28: qty 100

## 2024-07-28 MED ORDER — LIDOCAINE HCL (PF) 2 % IJ SOLN
INTRAMUSCULAR | Status: AC
Start: 2024-07-28 — End: 2024-07-28
  Filled 2024-07-28: qty 5

## 2024-07-28 MED ORDER — DEXMEDETOMIDINE HCL IN NACL 80 MCG/20ML IV SOLN
INTRAVENOUS | Status: DC | PRN
  Administered 2024-07-28: 8 ug via INTRAVENOUS
  Administered 2024-07-28: 12 ug via INTRAVENOUS

## 2024-07-28 NOTE — Op Note (Signed)
 [facility] Gastroenterology Patient Name: Craig Donaldson Procedure Date: 07/28/2024 7:14 AM MRN: [id number] Account #: [id number] Date of Birth: [birth year] Admit Type: Outpatient Age: 73 Room: [facility] ENDO ROOM 3 Gender: Male Note Status: Finalized Instrument Name: Colon Scope [id number] Procedure:             Colonoscopy Indications:           Surveillance: Personal history of adenomatous polyps                         on last colonoscopy > 5 years ago, Last colonoscopy:                         November 2005 Providers:             Ruel Kung MD, MD Referring MD:          Alda Carpen (Referring MD) Medicines:             Monitored Anesthesia Care Complications:         No immediate complications. Procedure:             Pre-Anesthesia Assessment:                        - Prior to the procedure, a History and Physical was                         performed, and patient medications, allergies and                         sensitivities were reviewed. The patient's tolerance                         of previous anesthesia was reviewed.                        - The risks and benefits of the procedure and the                         sedation options and risks were discussed with the                         patient. All questions were answered and informed                         consent was obtained.                        - ASA Grade Assessment: II - A patient with mild                         systemic disease.                        After obtaining informed consent, the colonoscope was                         passed under direct vision. Throughout the procedure,                         the patient's blood  pressure, pulse, and oxygen                         saturations were monitored continuously. The                         Colonoscope was introduced through the anus and                         advanced to the the cecum, identified by the                          appendiceal orifice. The colonoscopy was performed                         with ease. The patient tolerated the procedure well.                         The quality of the bowel preparation was good. The                         ileocecal valve, appendiceal orifice, and rectum were                         photographed. Findings:      The perianal and digital rectal examinations were normal.      Multiple medium-mouthed diverticula were found in the sigmoid colon.      The exam was otherwise without abnormality on direct and retroflexion       views. Impression:            - Diverticulosis in the sigmoid colon.                        - The examination was otherwise normal on direct and                         retroflexion views.                        - No specimens collected. Recommendation:        - Discharge patient to home (with escort).                        - Resume previous diet.                        - Continue present medications.                        - Repeat colonoscopy is not recommended due to current                         age (33 years or older) for surveillance. Procedure Code(s):     --- Professional ---                        [phone], Colonoscopy, flexible; diagnostic, including                         collection of specimen(s) by brushing or  washing, when                         performed (separate procedure) Diagnosis Code(s):     --- Professional ---                        Z86.010, Personal history of colonic polyps                        K57.30, Diverticulosis of large intestine without                         perforation or abscess without bleeding CPT copyright 2022 American Medical Association. All rights reserved. The codes documented in this report are preliminary and upon coder review may  be revised to meet current compliance requirements. Ruel Kung, MD Ruel Kung MD, MD 07/28/2024 8:04:32 AM This report has been signed electronically. Number of  Addenda: 0 Note Initiated On: 07/28/2024 7:14 AM Scope Withdrawal Time: 0 hours 8 minutes 57 seconds  Total Procedure Duration: 0 hours 12 minutes 19 seconds  Estimated Blood Loss:  Estimated blood loss: none.      [facility]

## 2024-07-28 NOTE — Transfer of Care (Signed)
 Immediate Anesthesia Transfer of Care Note  Patient: Craig Donaldson  Procedure(s) Performed: COLONOSCOPY  Patient Location: PACU  Anesthesia Type:General  Level of Consciousness: sedated  Airway & Oxygen Therapy: Patient Spontanous Breathing  Post-op Assessment: Report given to RN and Post -op Vital signs reviewed and stable  Post vital signs: Reviewed and stable  Last Vitals:  Vitals Value Taken Time  BP 81/58 07/28/24 08:04  Temp 35.9 C 07/28/24 08:04  Pulse 65 07/28/24 08:04  Resp 17 07/28/24 08:04  SpO2 98 % 07/28/24 08:04    Last Pain:  Vitals:   07/28/24 0804  TempSrc: Temporal  PainSc: Asleep         Complications: No notable events documented.

## 2024-07-28 NOTE — Anesthesia Preprocedure Evaluation (Signed)
 Anesthesia Evaluation  Patient identified by MRN, date of birth, ID band Patient awake    Reviewed: Allergy & Precautions, H&P , NPO status , Patient's Chart, lab work & pertinent test results, reviewed documented beta blocker date and time   History of Anesthesia Complications Negative for: history of anesthetic complications  Airway Mallampati: II  TM Distance: >3 FB Neck ROM: Full    Dental  (+) Lower Dentures, Upper Dentures   Pulmonary asthma , neg sleep apnea, neg COPD   breath sounds clear to auscultation       Cardiovascular hypertension, Pt. on home beta blockers and Pt. on medications (-) angina (-) Past MI and (-) Cardiac Stents (-) dysrhythmias  Rhythm:regular Rate:Normal     Neuro/Psych negative neurological ROS  negative psych ROS   GI/Hepatic negative GI ROS, Neg liver ROS,,,  Endo/Other  diabetes, Type 2, Insulin  Dependent    Renal/GU CRFRenal disease     Musculoskeletal   Abdominal   Peds  Hematology negative hematology ROS (+)   Anesthesia Other Findings Past Medical History: No date: Chronic kidney disease, stage 3a (HCC) No date: History of asthma     Comment:  NO ISSUES SINCE THE 1990'S No date: Hyperlipidemia No date: Hypertension No date: Left ureteral calculus No date: Type 2 diabetes mellitus (HCC)  Past Surgical History: 06/04/2013: CYSTOSCOPY WITH RETROGRADE PYELOGRAM, URETEROSCOPY AND  STENT PLACEMENT; Left     Comment:  Procedure: CYSTOSCOPY WITH RETROGRADE PYELOGRAM,               URETEROSCOPY AND STENT PLACEMENT;  Surgeon: Thomasine Oiler, MD;  Location: [facility];                Service: Urology;  Laterality: Left;  1 HR  06/04/2013: HOLMIUM LASER APPLICATION; Left     Comment:  Procedure: HOLMIUM LASER APPLICATION;  Surgeon:               Thomasine Oiler, MD;  Location: [facility];  Service: Urology;  Laterality:  Left;  BMI    Body Mass Index: 32.08     Reproductive/Obstetrics negative OB ROS                              Anesthesia Physical Anesthesia Plan  ASA: 3  Anesthesia Plan: General   Post-op Pain Management: Minimal or no pain anticipated   Induction: Intravenous  PONV Risk Score and Plan: 1 and Propofol  infusion and TIVA  Airway Management Planned: Natural Airway and Nasal Cannula  Additional Equipment:   Intra-op Plan:   Post-operative Plan:   Informed Consent: I have reviewed the patients History and Physical, chart, labs and discussed the procedure including the risks, benefits and alternatives for the proposed anesthesia with the patient or authorized representative who has indicated his/her understanding and acceptance.     Dental Advisory Given  Plan Discussed with: Anesthesiologist, CRNA and Surgeon  Anesthesia Plan Comments: (Patient consented for risks of anesthesia including but not limited to:  - adverse reactions to medications - risk of airway placement if required - damage to eyes, teeth, lips or other oral mucosa - nerve damage due to positioning  - sore throat or hoarseness - Damage to heart, brain, nerves, lungs, other parts of body or  loss of life  Patient voiced understanding and assent.)        Anesthesia Quick Evaluation

## 2024-07-28 NOTE — Anesthesia Postprocedure Evaluation (Signed)
 Anesthesia Post Note  Patient: Craig Donaldson  Procedure(s) Performed: COLONOSCOPY  Patient location during evaluation: Endoscopy Anesthesia Type: General Level of consciousness: awake and alert Pain management: pain level controlled Vital Signs Assessment: post-procedure vital signs reviewed and stable Respiratory status: spontaneous breathing, nonlabored ventilation, respiratory function stable and patient connected to nasal cannula oxygen Cardiovascular status: blood pressure returned to baseline and stable Postop Assessment: no apparent nausea or vomiting Anesthetic complications: no   No notable events documented.   Last Vitals:  Vitals:   07/28/24 0814 07/28/24 0824  BP: 96/68 98/69  Pulse: 62 (!) 31  Resp: 15 19  Temp:    SpO2: 94% 95%    Last Pain:  Vitals:   07/28/24 0824  TempSrc:   PainSc: 0-No pain                 Craig Donaldson

## 2024-07-28 NOTE — H&P (Signed)
 Ruel Kung , MD [address], [address], [place], [place], [address] Phone: [phone] Fax: [phone]  Primary Care Physician:  Alla Amis, MD   Pre-Procedure History & Physical: HPI:  Craig Donaldson is a 73 y.o. male is here for an colonoscopy.   Past Medical History:  Diagnosis Date   Asthma    Chronic kidney disease, stage 3a (HCC)    History of asthma    NO ISSUES SINCE THE 1990'S   Hyperlipidemia    Hypertension    Left ureteral calculus    Type 2 diabetes mellitus (HCC)    Wears dentures    full upper and lower    Past Surgical History:  Procedure Laterality Date   CATARACT EXTRACTION W/PHACO Left 09/26/2022   Procedure: CATARACT EXTRACTION PHACO AND INTRAOCULAR LENS PLACEMENT (IOC) LEFT  11.88  01:04.4;  Surgeon: Enola Feliciano Hugger, MD;  Location: [facility];  Service: Ophthalmology;  Laterality: Left;   CATARACT EXTRACTION W/PHACO Right 11/20/2023   Procedure: CATARACT EXTRACTION PHACO AND INTRAOCULAR LENS PLACEMENT (IOC) RIGHT DIABETIC;  Surgeon: Enola Feliciano Hugger, MD;  Location: [facility];  Service: Ophthalmology;  Laterality: Right;  15.95 1:18.9   CYSTOSCOPY WITH RETROGRADE PYELOGRAM, URETEROSCOPY AND STENT PLACEMENT Left 06/04/2013   Procedure: CYSTOSCOPY WITH RETROGRADE PYELOGRAM, URETEROSCOPY AND STENT PLACEMENT;  Surgeon: Thomasine Oiler, MD;  Location: [facility];  Service: Urology;  Laterality: Left;  1 HR    HOLMIUM LASER APPLICATION Left 06/04/2013   Procedure: HOLMIUM LASER APPLICATION;  Surgeon: Thomasine Oiler, MD;  Location: [facility];  Service: Urology;  Laterality: Left;   ORIF ANKLE FRACTURE Left 01/20/2022   Procedure: OPEN REDUCTION INTERNAL FIXATION (ORIF) ANKLE FRACTURE;  Surgeon: Marchia Drivers, MD;  Location: [facility];  Service: Orthopedics;  Laterality: Left;   PULMONARY THROMBECTOMY Bilateral 02/18/2022   Procedure: PULMONARY THROMBECTOMY;  Surgeon: Marea Selinda RAMAN, MD;   Location: [facility];  Service: Cardiovascular;  Laterality: Bilateral;    Prior to Admission medications   Medication Sig Start Date End Date Taking? Authorizing Provider  albuterol  (VENTOLIN  HFA) 108 (90 Base) MCG/ACT inhaler Inhale 2 puffs into the lungs in the morning, at noon, in the evening, and at bedtime. 10/29/21  Yes [provider]  atorvastatin  (LIPITOR) 20 MG tablet Take 20 mg by mouth every morning.   Yes [provider]  benazepril  (LOTENSIN ) 5 MG tablet Take 5 mg by mouth daily.   Yes [provider]  calcium  carbonate (CALCIUM  600) 600 MG TABS tablet Take 600 mg by mouth daily.   Yes [provider]  Cholecalciferol  25 MCG (1000 UT) capsule Take 1,000 Units by mouth daily.   Yes [provider]  cyanocobalamin  1000 MCG tablet Take 1,000 mcg by mouth daily.   Yes [provider]  docusate sodium  (COLACE) 100 MG capsule Take 1 capsule (100 mg total) by mouth 2 (two) times daily. 01/22/22  Yes Patsy Lenis, MD  fenofibrate  160 MG tablet Take 160 mg by mouth daily. 12/09/21  Yes [provider]  ferrous fumarate-b12-vitamic C-folic acid (TRINSICON / FOLTRIN) capsule Take 1 capsule by mouth 2 (two) times daily after a meal. 02/19/22  Yes Caleen Qualia, MD  magnesium  oxide (MAG-OX) 400 MG tablet Take 400 mg by mouth daily.   Yes [provider]  metFORMIN  (GLUCOPHAGE ) 1000 MG tablet Take 1,000 mg by mouth 2 (two) times daily with a meal.   Yes [provider]  metoprolol  succinate (  TOPROL -XL) 50 MG 24 hr tablet Take 25 mg by mouth every morning. Take with or immediately following a meal.   Yes [provider]  Omega-3 Fatty Acids (FISH OIL) 1000 MG CAPS Take 1 capsule by mouth daily.   Yes [provider]  pioglitazone (ACTOS) 15 MG tablet Take 15 mg by mouth daily.   Yes [provider]  rivaroxaban  (XARELTO ) 20 MG TABS tablet TAKE 1 TABLET BY MOUTH ONCE DAILY WITH SUPPER  04/02/24  Yes Dew, Jason S, MD  rivaroxaban  (XARELTO ) 20 MG TABS tablet Take 20 mg by mouth daily with supper.   Yes [provider]  senna (SENOKOT) 8.6 MG tablet Take 1 tablet by mouth daily.   Yes [provider]  TOUJEO  MAX SOLOSTAR 300 UNIT/ML Solostar Pen Inject 36 Units into the skin daily. 12/13/21  Yes [provider]  Ubiquinol (QUNOL COQ10/UBIQUINOL/MEGA) 100 MG CAPS Take by mouth daily with supper.   Yes [provider]  glimepiride  (AMARYL ) 4 MG tablet Take 4 mg by mouth 2 (two) times daily. Patient not taking: Reported on 07/21/2024    [provider]  Potassium Citrate,Elemental K, 99 MG CAPS Take 1 capsule by mouth daily. Patient not taking: Reported on 07/21/2024    [provider]    Allergies as of 07/13/2024 - Review Complete 06/11/2024  Allergen Reaction Noted   Amoxicillin Rash 06/08/2019   Other Rash 03/25/2017    Family History  Problem Relation Age of Onset   Lung cancer Father    Diabetes Brother     Social History   Socioeconomic History   Marital status: Married    Spouse name: Not on file   Number of children: Not on file   Years of education: Not on file   Highest education level: Not on file  Occupational History   Not on file  Tobacco Use   Smoking status: Never   Smokeless tobacco: Current    Types: Snuff   Tobacco comments:    34 YR TOBACCO USE  Vaping Use   Vaping status: Never Used  Substance and Sexual Activity   Alcohol use: No   Drug use: No   Sexual activity: Not on file  Other Topics Concern   Not on file  Social History Narrative   Not on file   Social Drivers of Health   Financial Resource Strain: Low Risk  (05/05/2024)   [facility]   Overall Financial Resource Strain (CARDIA)    Difficulty of Paying Living Expenses: Not hard at all  Food Insecurity: No Food Insecurity (05/05/2024)   [facility]   Hunger  Vital Sign    Within the past 12 months, you worried that your food would run out before you got the money to buy more.: Never true    Within the past 12 months, the food you bought just didn't last and you didn't have money to get more.: Never true  Transportation Needs: No Transportation Needs (05/05/2024)   Received from [facility] - Transportation    In the past 12 months, has lack of transportation kept you from medical appointments or from getting medications?: No    Lack of Transportation (Non-Medical): No  Physical Activity: Not on file  Stress: Not on file  Social Connections: Not on file  Intimate Partner Violence: Not on file    Review of Systems: See HPI, otherwise negative ROS  Physical  Exam: BP 136/80   Pulse 64   Temp (!) 96.6 F (35.9 C) (Temporal)   Resp 20   Ht 5' 5 (1.651 m)   Wt 87.5 kg   SpO2 99%   BMI 32.08 kg/m  General:   Alert,  pleasant and cooperative in NAD Head:  Normocephalic and atraumatic. Neck:  Supple; no masses or thyromegaly. Lungs:  Clear throughout to auscultation, normal respiratory effort.    Heart:  +S1, +S2, Regular rate and rhythm, No edema. Abdomen:  Soft, nontender and nondistended. Normal bowel sounds, without guarding, and without rebound.   Neurologic:  Alert and  oriented x4;  grossly normal neurologically.  Impression/Plan: Derian E Weinheimer is here for an colonoscopy to be performed for surveillance due to prior history of colon polyps   Risks, benefits, limitations, and alternatives regarding  colonoscopy have been reviewed with the patient.  Questions have been answered.  All parties agreeable.   Ruel Kung, MD  07/28/2024, 7:42 AM

## 2025-03-03 ENCOUNTER — Other Ambulatory Visit
